# Patient Record
Sex: Female | Born: 1990 | Hispanic: Yes | Marital: Single | Smoking: Never smoker
Health system: Southern US, Community
[De-identification: ages and names within clinical notes are randomized; demographics above are authoritative.]

## PROBLEM LIST (undated history)

## (undated) ENCOUNTER — Inpatient Hospital Stay (HOSPITAL_COMMUNITY): Payer: Self-pay

## (undated) DIAGNOSIS — Z789 Other specified health status: Secondary | ICD-10-CM

## (undated) HISTORY — PX: NO PAST SURGERIES: SHX2092

---
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
## 2009-09-26 ENCOUNTER — Emergency Department (HOSPITAL_COMMUNITY): Admission: EM | Admit: 2009-09-26 | Discharge: 2009-09-26 | Payer: Self-pay | Admitting: Family Medicine

## 2010-03-01 ENCOUNTER — Emergency Department (HOSPITAL_COMMUNITY)
Admission: EM | Admit: 2010-03-01 | Discharge: 2010-03-01 | Disposition: A | Discharge status: Home / Self Care | Payer: Self-pay | Attending: Emergency Medicine | Admitting: Emergency Medicine

## 2010-03-01 DIAGNOSIS — R109 Unspecified abdominal pain: Secondary | ICD-10-CM | POA: Insufficient documentation

## 2010-03-01 DIAGNOSIS — N898 Other specified noninflammatory disorders of vagina: Secondary | ICD-10-CM | POA: Insufficient documentation

## 2010-03-01 DIAGNOSIS — N72 Inflammatory disease of cervix uteri: Secondary | ICD-10-CM | POA: Insufficient documentation

## 2010-03-01 LAB — URINE MICROSCOPIC-ADD ON

## 2010-03-01 LAB — URINALYSIS, ROUTINE W REFLEX MICROSCOPIC
Bilirubin Urine: NEGATIVE
Ketones, ur: 15 mg/dL — AB
Nitrite: NEGATIVE
Specific Gravity, Urine: 1.017 (ref 1.005–1.030)
pH: 8.5 — ABNORMAL HIGH (ref 5.0–8.0)

## 2010-03-01 LAB — WET PREP, GENITAL
Trich, Wet Prep: NONE SEEN
Yeast Wet Prep HPF POC: NONE SEEN

## 2010-03-02 LAB — GC/CHLAMYDIA PROBE AMP, GENITAL: GC Probe Amp, Genital: NEGATIVE

## 2010-04-14 LAB — POCT URINALYSIS DIPSTICK
Bilirubin Urine: NEGATIVE
Glucose, UA: NEGATIVE mg/dL
Ketones, ur: NEGATIVE mg/dL

## 2010-04-14 LAB — POCT PREGNANCY, URINE

## 2010-07-02 ENCOUNTER — Emergency Department (HOSPITAL_COMMUNITY)
Admission: EM | Admit: 2010-07-02 | Discharge: 2010-07-02 | Discharge status: Against Medical Advice | Payer: BC Managed Care – PPO | Attending: Emergency Medicine | Admitting: Emergency Medicine

## 2010-07-02 DIAGNOSIS — O9989 Other specified diseases and conditions complicating pregnancy, childbirth and the puerperium: Secondary | ICD-10-CM | POA: Insufficient documentation

## 2010-07-02 DIAGNOSIS — R109 Unspecified abdominal pain: Secondary | ICD-10-CM | POA: Insufficient documentation

## 2010-07-02 LAB — URINALYSIS, ROUTINE W REFLEX MICROSCOPIC
Hgb urine dipstick: NEGATIVE
Protein, ur: NEGATIVE mg/dL
Urobilinogen, UA: 0.2 mg/dL (ref 0.0–1.0)

## 2010-07-02 LAB — URINE MICROSCOPIC-ADD ON

## 2010-07-23 ENCOUNTER — Emergency Department (HOSPITAL_COMMUNITY)
Admission: EM | Admit: 2010-07-23 | Discharge: 2010-07-23 | Discharge status: Against Medical Advice | Payer: BC Managed Care – PPO | Attending: Emergency Medicine | Admitting: Emergency Medicine

## 2010-07-23 ENCOUNTER — Emergency Department (HOSPITAL_COMMUNITY)
Admission: EM | Admit: 2010-07-23 | Discharge: 2010-07-24 | Disposition: A | Discharge status: Home / Self Care | Payer: BC Managed Care – PPO | Attending: Emergency Medicine | Admitting: Emergency Medicine

## 2010-07-23 DIAGNOSIS — R109 Unspecified abdominal pain: Secondary | ICD-10-CM | POA: Insufficient documentation

## 2010-07-23 DIAGNOSIS — O99891 Other specified diseases and conditions complicating pregnancy: Secondary | ICD-10-CM | POA: Insufficient documentation

## 2010-07-23 DIAGNOSIS — M549 Dorsalgia, unspecified: Secondary | ICD-10-CM | POA: Insufficient documentation

## 2010-07-23 LAB — URINE MICROSCOPIC-ADD ON

## 2010-07-23 LAB — URINALYSIS, ROUTINE W REFLEX MICROSCOPIC
Glucose, UA: NEGATIVE mg/dL
Ketones, ur: NEGATIVE mg/dL
pH: 6.5 (ref 5.0–8.0)

## 2010-07-24 ENCOUNTER — Emergency Department (HOSPITAL_COMMUNITY): Payer: BC Managed Care – PPO

## 2010-07-24 LAB — HCG, QUANTITATIVE, PREGNANCY: hCG, Beta Chain, Quant, S: 54337 m[IU]/mL — ABNORMAL HIGH (ref <5)

## 2010-08-05 ENCOUNTER — Emergency Department (HOSPITAL_COMMUNITY)
Admission: EM | Admit: 2010-08-05 | Discharge: 2010-08-06 | Disposition: A | Discharge status: Home / Self Care | Payer: Self-pay | Attending: Emergency Medicine | Admitting: Emergency Medicine

## 2010-08-05 DIAGNOSIS — O210 Mild hyperemesis gravidarum: Secondary | ICD-10-CM | POA: Insufficient documentation

## 2010-08-05 LAB — URINALYSIS, ROUTINE W REFLEX MICROSCOPIC
Bilirubin Urine: NEGATIVE
Glucose, UA: NEGATIVE mg/dL
Hgb urine dipstick: NEGATIVE
Ketones, ur: NEGATIVE mg/dL
Leukocytes, UA: NEGATIVE
Protein, ur: NEGATIVE mg/dL

## 2010-08-05 LAB — DIFFERENTIAL
Basophils Absolute: 0 10*3/uL (ref 0.0–0.1)
Basophils Relative: 0 % (ref 0–1)
Lymphocytes Relative: 30 % (ref 12–46)
Neutro Abs: 5.7 10*3/uL (ref 1.7–7.7)
Neutrophils Relative %: 61 % (ref 43–77)

## 2010-08-05 LAB — BASIC METABOLIC PANEL
BUN: 5 mg/dL — ABNORMAL LOW (ref 6–23)
Chloride: 104 mEq/L (ref 96–112)
Glucose, Bld: 84 mg/dL (ref 70–99)
Potassium: 3.3 mEq/L — ABNORMAL LOW (ref 3.5–5.1)
Sodium: 137 mEq/L (ref 135–145)

## 2010-08-05 LAB — CBC
HCT: 36 % (ref 36.0–46.0)
Hemoglobin: 13.2 g/dL (ref 12.0–15.0)
WBC: 9.4 10*3/uL (ref 4.0–10.5)

## 2010-08-05 LAB — URINE MICROSCOPIC-ADD ON

## 2010-08-07 LAB — GC/CHLAMYDIA PROBE AMP, GENITAL: Chlamydia, DNA Probe: NEGATIVE

## 2011-01-30 NOTE — L&D Delivery Note (Signed)
Called for delivery while Dr. Tamela Oddi in the operating room. Patient fully dilated and pushing. Delivery of viable female infant over intact perineum with Apgars 7 and 9, weight 8lb 0.9oz, clear fluid. Nuchal cord x 1 reduced. Cord clamped and cut. Placenta delivered whole and intact with 3-vessel cord. Second degree perineal laceration noted and repaired with 2.0 Vicryl with excellent hemostasis and restoration of anatomy. No cervical, vaginal, or periurethral laceration noted. Fundus firm. Vaginal vault empty. Patient experienced uterine atony with estimated blood loss of 400 cc which was corrected with fundal massage and administration of 1000 mcg of cytotec per rectum.

## 2011-02-26 ENCOUNTER — Encounter (HOSPITAL_COMMUNITY): Payer: Self-pay | Admitting: *Deleted

## 2011-02-26 ENCOUNTER — Inpatient Hospital Stay (HOSPITAL_COMMUNITY)
Admission: AD | Admit: 2011-02-26 | Discharge: 2011-02-26 | Disposition: A | Discharge status: Home / Self Care | Payer: Self-pay | Source: Ambulatory Visit | Attending: Obstetrics & Gynecology | Admitting: Obstetrics & Gynecology

## 2011-02-26 DIAGNOSIS — O479 False labor, unspecified: Secondary | ICD-10-CM | POA: Insufficient documentation

## 2011-02-26 DIAGNOSIS — R109 Unspecified abdominal pain: Secondary | ICD-10-CM | POA: Insufficient documentation

## 2011-02-26 LAB — URINALYSIS, ROUTINE W REFLEX MICROSCOPIC
Glucose, UA: NEGATIVE mg/dL
Hgb urine dipstick: NEGATIVE
Specific Gravity, Urine: 1.02 (ref 1.005–1.030)
Urobilinogen, UA: 0.2 mg/dL (ref 0.0–1.0)

## 2011-02-26 LAB — WET PREP, GENITAL
Trich, Wet Prep: NONE SEEN
Yeast Wet Prep HPF POC: NONE SEEN

## 2011-02-26 NOTE — Progress Notes (Signed)
Pt. brought to room #7 via W/C from lobby due to C/O pain

## 2011-02-26 NOTE — Progress Notes (Signed)
States had back pain last night. Describes abdominal pain as stabbing, worse when walking.

## 2011-02-26 NOTE — ED Provider Notes (Signed)
History    G2P0 presents at [redacted]w[redacted]d with report of lower abdominal pain "when I walk" and vaginal discharge.  She also reports a small gush of fluid earlier today when she sneezed but denies current LOF.  She also denies vaginal bleeding or regular cramping.  She reports normal fetal movement today.    Chief Complaint  Patient presents with  . Abdominal Pain   HPI  OB History    Grav Para Term Preterm Abortions TAB SAB Ect Mult Living   2 0 0 0 0 0 0 0 0 0         History  Substance Use Topics  . Smoking status: Never Smoker   . Smokeless tobacco: Never Used  . Alcohol Use: No    Allergies: No Known Allergies  Prescriptions prior to admission  Medication Sig Dispense Refill  . diphenhydramine-acetaminophen (TYLENOL PM) 25-500 MG TABS Take 1 tablet by mouth at bedtime as needed. Patient has used this medication for sleep.      . Prenatal Vit-Fe Fumarate-FA (PRENATAL MULTIVITAMIN) TABS Take 1 tablet by mouth daily.        Review of Systems  Constitutional: Negative for fever and chills.  Gastrointestinal: Positive for abdominal pain.  Genitourinary: Negative for dysuria, urgency and frequency.   Physical Exam   Blood pressure 104/75, pulse 90, temperature 97.1 F (36.2 C), temperature source Oral, resp. rate 18, SpO2 98.00%.  Physical Exam  Constitutional: She is oriented to person, place, and time. She appears well-developed and well-nourished.  Respiratory: Effort normal.  GI: Soft.  Genitourinary: Vagina normal.       Pelvic exam: Small amount white creamy discharge, no pooling of fluid noted in posterior fornix and no fluid from cervical os when pt coughs.  Cervix 1/50/-3  Musculoskeletal: Normal range of motion.  Neurological: She is alert and oriented to person, place, and time.  Skin: Skin is warm and dry.  Psychiatric: She has a normal mood and affect. Her behavior is normal. Judgment and thought content normal.   FHR 125 with accels, no decels and moderate  variability Contractions 1 in 10 minutes, irregular, lasting 40-70 seconds Results for orders placed during the hospital encounter of 02/26/11 (from the past 24 hour(s))  WET PREP, GENITAL     Status: Abnormal   Collection Time   02/26/11  3:04 PM      Component Value Range   Yeast, Wet Prep NONE SEEN  NONE SEEN    Trich, Wet Prep NONE SEEN  NONE SEEN    Clue Cells, Wet Prep NONE SEEN  NONE SEEN    WBC, Wet Prep HPF POC FEW (*) NONE SEEN   POCT FERN TEST     Status: Normal   Collection Time   02/26/11  3:14 PM      Component Value Range   Fern Test Negative    POCT FERN TEST     Status: Normal   Collection Time   02/26/11  3:16 PM      Component Value Range   Fern Test Negative    URINALYSIS, ROUTINE W REFLEX MICROSCOPIC     Status: Normal   Collection Time   02/26/11  3:52 PM      Component Value Range   Color, Urine YELLOW  YELLOW    APPearance CLEAR  CLEAR    Specific Gravity, Urine 1.020  1.005 - 1.030    pH 6.5  5.0 - 8.0    Glucose, UA NEGATIVE  NEGATIVE (mg/dL)  Hgb urine dipstick NEGATIVE  NEGATIVE    Bilirubin Urine NEGATIVE  NEGATIVE    Ketones, ur NEGATIVE  NEGATIVE (mg/dL)   Protein, ur NEGATIVE  NEGATIVE (mg/dL)   Urobilinogen, UA 0.2  0.0 - 1.0 (mg/dL)   Nitrite NEGATIVE  NEGATIVE    Leukocytes, UA NEGATIVE  NEGATIVE    MAU Course  Procedures U/A Pelvic exam with wet prep, slide for ferning, GC/Chlamydia Cervical exam FHR monitoring  Discussed POC with Dr. Tamela Oddi and she agrees with D/C with labor precautions. Assessment and Plan  A: Braxton-Hicks contractions Discomforts of pregnancy  P: D/C home with labor precautions  LEFTWICH-KIRBY, Amirra Herling 02/26/2011, 3:20 PM

## 2011-02-26 NOTE — Progress Notes (Signed)
Abdominal pain, thick yellow D/C. D/C started last week, pain since last night

## 2011-02-26 NOTE — Progress Notes (Signed)
[facility] Department of Clinical Social Work Documentation of Interpretation   I assisted _Susan RN__________________ with interpretation of ___questions___________________ for this patient. 

## 2011-02-27 LAB — GC/CHLAMYDIA PROBE AMP, GENITAL: GC Probe Amp, Genital: NEGATIVE

## 2011-03-09 ENCOUNTER — Encounter (HOSPITAL_COMMUNITY): Payer: Self-pay | Admitting: *Deleted

## 2011-03-09 ENCOUNTER — Inpatient Hospital Stay (HOSPITAL_COMMUNITY)
Admission: AD | Admit: 2011-03-09 | Discharge: 2011-03-12 | DRG: 774 | Disposition: A | Discharge status: Home / Self Care | Payer: Medicaid Other | Source: Ambulatory Visit | Attending: Obstetrics & Gynecology | Admitting: Obstetrics & Gynecology

## 2011-03-09 HISTORY — DX: Other specified health status: Z78.9

## 2011-03-09 LAB — CBC
HCT: 36.5 % (ref 36.0–46.0)
HCT: 34 % — ABNORMAL LOW (ref 36.0–46.0)
Hemoglobin: 11.9 g/dL — ABNORMAL LOW (ref 12.0–15.0)
Hemoglobin: 11.1 g/dL — ABNORMAL LOW (ref 12.0–15.0)
MCH: 25.7 pg — ABNORMAL LOW (ref 26.0–34.0)
MCHC: 32.6 g/dL (ref 30.0–36.0)
MCV: 78.5 fL (ref 78.0–100.0)
Platelets: 257 10*3/uL (ref 150–400)
RBC: 4.33 MIL/uL (ref 3.87–5.11)
WBC: 13.5 10*3/uL — ABNORMAL HIGH (ref 4.0–10.5)

## 2011-03-09 LAB — HIV ANTIBODY (ROUTINE TESTING W REFLEX): HIV: NONREACTIVE

## 2011-03-09 LAB — DIFFERENTIAL
Eosinophils Relative: 0 % (ref 0–5)
Lymphocytes Relative: 13 % (ref 12–46)
Lymphs Abs: 1.7 10*3/uL (ref 0.7–4.0)
Monocytes Absolute: 0.7 10*3/uL (ref 0.1–1.0)
Monocytes Relative: 5 % (ref 3–12)

## 2011-03-09 LAB — RPR: RPR: NONREACTIVE

## 2011-03-09 LAB — RUBELLA ANTIBODY, IGM: Rubella: IMMUNE

## 2011-03-09 LAB — RAPID HIV SCREEN (WH-MAU): Rapid HIV Screen: NONREACTIVE

## 2011-03-09 MED ORDER — ACETAMINOPHEN 325 MG PO TABS: 650.0000 mg | ORAL_TABLET | ORAL | Status: DC | PRN

## 2011-03-09 MED ORDER — LACTATED RINGERS IV SOLN
INTRAVENOUS | Status: DC
  Administered 2011-03-10: 02:00:00 via INTRAVENOUS

## 2011-03-09 MED ORDER — LACTATED RINGERS IV SOLN: 500.0000 mL | INTRAVENOUS | Status: DC | PRN

## 2011-03-09 MED ORDER — ONDANSETRON HCL 4 MG/2ML IJ SOLN: 4.0000 mg | Freq: Four times a day (QID) | INTRAMUSCULAR | Status: DC | PRN

## 2011-03-09 MED ORDER — CITRIC ACID-SODIUM CITRATE 334-500 MG/5ML PO SOLN: 30.0000 mL | ORAL | Status: DC | PRN

## 2011-03-09 MED ORDER — LIDOCAINE HCL (PF) 1 % IJ SOLN
30.0000 mL | INTRAMUSCULAR | Status: AC | PRN
  Administered 2011-03-10: 30 mL via SUBCUTANEOUS
  Filled 2011-03-09: qty 30

## 2011-03-09 MED ORDER — IBUPROFEN 600 MG PO TABS
600.0000 mg | ORAL_TABLET | Freq: Four times a day (QID) | ORAL | Status: DC | PRN
  Administered 2011-03-10: 600 mg via ORAL
  Filled 2011-03-09: qty 1

## 2011-03-09 MED ORDER — OXYTOCIN 20 UNITS IN LACTATED RINGERS INFUSION - SIMPLE
125.0000 mL/h | Freq: Once | INTRAVENOUS | Status: AC
  Administered 2011-03-10: 500 mL/h via INTRAVENOUS

## 2011-03-09 MED ORDER — FLEET ENEMA 7-19 GM/118ML RE ENEM: 1.0000 | ENEMA | RECTAL | Status: DC | PRN

## 2011-03-09 MED ORDER — OXYCODONE-ACETAMINOPHEN 5-325 MG PO TABS: 1.0000 | ORAL_TABLET | ORAL | Status: DC | PRN

## 2011-03-09 MED ORDER — OXYTOCIN BOLUS FROM INFUSION
500.0000 mL | Freq: Once | INTRAVENOUS | Status: DC
  Filled 2011-03-09: qty 1000
  Filled 2011-03-09: qty 500

## 2011-03-09 MED ORDER — BUTORPHANOL TARTRATE 2 MG/ML IJ SOLN
1.0000 mg | INTRAMUSCULAR | Status: DC | PRN
  Administered 2011-03-09 (×2): 1 mg via INTRAVENOUS
  Filled 2011-03-09 (×2): qty 1

## 2011-03-09 NOTE — H&P (Signed)
Amber Murphy is a 21 y.o. female presenting for contractions. Maternal Medical History:  Reason for admission: Reason for admission: contractions.  Contractions: Frequency: regular.    Fetal activity: Perceived fetal activity is normal.    Prenatal complications: no prenatal complications   OB History    Grav Para Term Preterm Abortions TAB SAB Ect Mult Living   1         0     Past Medical History  Diagnosis Date  . No pertinent past medical history    Past Surgical History  Procedure Date  . No past surgeries    Family History: family history includes Early death in her father. Social History:  reports that she has never smoked. She has never used smokeless tobacco. She reports that she does not drink alcohol or use illicit drugs.  Review of Systems  Constitutional: Negative for fever.  Eyes: Negative for blurred vision.  Respiratory: Negative for shortness of breath.   Gastrointestinal: Negative for vomiting.  Skin: Negative for rash.  Neurological: Negative for headaches.    Dilation: 5.5 Effacement (%): 90 Station: -2 Exam by:: Penley, RN  Blood pressure 127/82, pulse 81, temperature 97.9 F (36.6 C), temperature source Oral, resp. rate 18, height 5\' 4"  (1.626 m), weight 66.225 kg (146 lb). Maternal Exam:  Abdomen: Fetal presentation: vertex  Introitus: not evaluated.   Cervix: Cervix evaluated by digital exam.     Fetal Exam Fetal Monitor Review: Variability: moderate (6-25 bpm).   Pattern: accelerations present and no decelerations.    Fetal State Assessment: Category I - tracings are normal.     Physical Exam  Constitutional: She appears well-developed.  HENT:  Head: Normocephalic.  Neck: Neck supple. No thyromegaly present.  Cardiovascular: Normal rate and regular rhythm.   Respiratory: Breath sounds normal.  GI: Soft. Bowel sounds are normal.  Skin: No rash noted.    Prenatal labs: ABO, Rh: --/--/O POS (02/08 2211) Antibody:     Rubella:   RPR:    HBsAg:    HIV: Non-reactive (02/08 2318)  GBS: Unknown (02/08 0000)   Assessment/Plan: [place] at term, active labor, Category 1 FHT. Admit, anticipate an NSVD   JACKSON-MOORE,Flora Ratz A 03/09/2011, 11:21 PM

## 2011-03-09 NOTE — Progress Notes (Signed)
Pt seen in [facility] office today and told VE 4cm.  Pt has had U/C's today since 1100 coming 2-5 minutes.  No ROM, pt having bloody show with mucous.

## 2011-03-10 ENCOUNTER — Encounter (HOSPITAL_COMMUNITY): Payer: Self-pay | Admitting: *Deleted

## 2011-03-10 LAB — ANTIBODY SCREEN: Antibody Screen: NEGATIVE

## 2011-03-10 LAB — HEMOGLOBIN AND HEMATOCRIT, BLOOD: HCT: 26.5 % — ABNORMAL LOW (ref 36.0–46.0)

## 2011-03-10 MED ORDER — TETANUS-DIPHTH-ACELL PERTUSSIS 5-2.5-18.5 LF-MCG/0.5 IM SUSP
0.5000 mL | Freq: Once | INTRAMUSCULAR | Status: AC
  Administered 2011-03-11: 0.5 mL via INTRAMUSCULAR
  Filled 2011-03-10: qty 0.5

## 2011-03-10 MED ORDER — INFLUENZA VIRUS VACC SPLIT PF IM SUSP
0.5000 mL | INTRAMUSCULAR | Status: AC
  Administered 2011-03-11: 0.5 mL via INTRAMUSCULAR
  Filled 2011-03-10: qty 0.5

## 2011-03-10 MED ORDER — MEDROXYPROGESTERONE ACETATE 150 MG/ML IM SUSP: 150.0000 mg | INTRAMUSCULAR | Status: DC | PRN

## 2011-03-10 MED ORDER — PHENYLEPHRINE 40 MCG/ML (10ML) SYRINGE FOR IV PUSH (FOR BLOOD PRESSURE SUPPORT): 80.0000 ug | PREFILLED_SYRINGE | INTRAVENOUS | Status: DC | PRN

## 2011-03-10 MED ORDER — MISOPROSTOL 200 MCG PO TABS
ORAL_TABLET | ORAL | Status: AC
  Administered 2011-03-10: 1000 ug via RECTAL
  Filled 2011-03-10: qty 5

## 2011-03-10 MED ORDER — DIBUCAINE 1 % RE OINT: 1.0000 "application " | TOPICAL_OINTMENT | RECTAL | Status: DC | PRN

## 2011-03-10 MED ORDER — MEASLES, MUMPS & RUBELLA VAC ~~LOC~~ INJ
0.5000 mL | INJECTION | Freq: Once | SUBCUTANEOUS | Status: DC
  Filled 2011-03-10: qty 0.5

## 2011-03-10 MED ORDER — LACTATED RINGERS IV SOLN: 500.0000 mL | Freq: Once | INTRAVENOUS | Status: DC

## 2011-03-10 MED ORDER — FERROUS SULFATE 325 (65 FE) MG PO TABS
325.0000 mg | ORAL_TABLET | Freq: Two times a day (BID) | ORAL | Status: DC
  Administered 2011-03-10 – 2011-03-12 (×5): 325 mg via ORAL
  Filled 2011-03-10 (×5): qty 1

## 2011-03-10 MED ORDER — SENNOSIDES-DOCUSATE SODIUM 8.6-50 MG PO TABS
2.0000 | ORAL_TABLET | Freq: Every day | ORAL | Status: DC
  Administered 2011-03-10 – 2011-03-11 (×2): 2 via ORAL

## 2011-03-10 MED ORDER — FENTANYL 2.5 MCG/ML BUPIVACAINE 1/10 % EPIDURAL INFUSION (WH - ANES): 14.0000 mL/h | INTRAMUSCULAR | Status: DC

## 2011-03-10 MED ORDER — ONDANSETRON HCL 4 MG/2ML IJ SOLN: 4.0000 mg | INTRAMUSCULAR | Status: DC | PRN

## 2011-03-10 MED ORDER — PRENATAL MULTIVITAMIN CH
1.0000 | ORAL_TABLET | Freq: Every day | ORAL | Status: DC
  Administered 2011-03-10 – 2011-03-12 (×3): 1 via ORAL
  Filled 2011-03-10 (×3): qty 1

## 2011-03-10 MED ORDER — IBUPROFEN 600 MG PO TABS
600.0000 mg | ORAL_TABLET | Freq: Four times a day (QID) | ORAL | Status: DC
  Administered 2011-03-10 – 2011-03-12 (×8): 600 mg via ORAL
  Filled 2011-03-10 (×8): qty 1

## 2011-03-10 MED ORDER — EPHEDRINE 5 MG/ML INJ: 10.0000 mg | INTRAVENOUS | Status: DC | PRN

## 2011-03-10 MED ORDER — MAGNESIUM HYDROXIDE 400 MG/5ML PO SUSP: 30.0000 mL | ORAL | Status: DC | PRN

## 2011-03-10 MED ORDER — OXYCODONE-ACETAMINOPHEN 5-325 MG PO TABS
1.0000 | ORAL_TABLET | ORAL | Status: DC | PRN
  Administered 2011-03-10 (×3): 1 via ORAL
  Administered 2011-03-11: 2 via ORAL
  Filled 2011-03-10 (×2): qty 1
  Filled 2011-03-10: qty 2
  Filled 2011-03-10: qty 1

## 2011-03-10 MED ORDER — BENZOCAINE-MENTHOL 20-0.5 % EX AERO
INHALATION_SPRAY | CUTANEOUS | Status: AC
  Administered 2011-03-10: 08:00:00
  Filled 2011-03-10: qty 56

## 2011-03-10 MED ORDER — BENZOCAINE-MENTHOL 20-0.5 % EX AERO: 1.0000 "application " | INHALATION_SPRAY | CUTANEOUS | Status: DC | PRN

## 2011-03-10 MED ORDER — ONDANSETRON HCL 4 MG PO TABS: 4.0000 mg | ORAL_TABLET | ORAL | Status: DC | PRN

## 2011-03-10 MED ORDER — ZOLPIDEM TARTRATE 5 MG PO TABS: 5.0000 mg | ORAL_TABLET | Freq: Every evening | ORAL | Status: DC | PRN

## 2011-03-10 MED ORDER — DIPHENHYDRAMINE HCL 50 MG/ML IJ SOLN: 12.5000 mg | INTRAMUSCULAR | Status: DC | PRN

## 2011-03-10 MED ORDER — WITCH HAZEL-GLYCERIN EX PADS: 1.0000 "application " | MEDICATED_PAD | CUTANEOUS | Status: DC | PRN

## 2011-03-10 MED ORDER — LANOLIN HYDROUS EX OINT: TOPICAL_OINTMENT | CUTANEOUS | Status: DC | PRN

## 2011-03-10 MED ORDER — DIPHENHYDRAMINE HCL 25 MG PO CAPS: 25.0000 mg | ORAL_CAPSULE | Freq: Four times a day (QID) | ORAL | Status: DC | PRN

## 2011-03-10 NOTE — Progress Notes (Signed)
Patient ID: Amber Murphy, female   DOB: September 24, 1990, 21 y.o.   MRN: [id number] Post Partum Day 0 S/P spontaneous vaginal RH status/Rubella reviewed.  Feeding: breast Subjective: No HA, SOB, CP, F/C, breast symptoms. Normal vaginal bleeding, no clots.     Objective: BP 95/60  Pulse 106  Temp(Src) 98.3 F (36.8 C) (Axillary)  Resp 20  Ht 5\' 4"  (1.626 m)  Wt 66.225 kg (146 lb)  BMI 25.06 kg/m2  Breastfeeding? Unknown I&O reviewed.    Physical Exam:  Pt w/Pediatrician.  Bathing infant  Basename 03/09/11 2211 03/09/11 2130  HGB 11.1* 11.9*  HCT 34.0* 36.5      Assessment/Plan: 21 y.o.  PPD #0 .  Tachycardic.  Atony at time of delivery.  Lochia OK at present Continue current postpartum care Check H&H   LOS: 1 day   JACKSON-MOORE,Amber Murphy 03/10/2011, 11:04 AM

## 2011-03-11 LAB — CBC
HCT: 27.5 % — ABNORMAL LOW (ref 36.0–46.0)
Hemoglobin: 8.9 g/dL — ABNORMAL LOW (ref 12.0–15.0)
MCHC: 32.4 g/dL (ref 30.0–36.0)
MCV: 79 fL (ref 78.0–100.0)
RDW: 15.4 % (ref 11.5–15.5)

## 2011-03-11 NOTE — Progress Notes (Signed)
Patient ID: Amber Murphy, female   DOB: 14-Apr-1990, 21 y.o.   MRN: [id number] Post Partum Day 1 S/P spontaneous vaginal RH status/Rubella reviewed.  Feeding: breast Subjective: No HA, SOB, CP, F/C, breast symptoms. Normal vaginal bleeding, no clots.     Objective: BP 109/61  Pulse 99  Temp(Src) 97.8 F (36.6 C) (Oral)  Resp 18  Ht 5\' 4"  (1.626 m)  Wt 66.225 kg (146 lb)  BMI 25.06 kg/m2  Breastfeeding? Unknown   Physical Exam:  General: alert Lochia: appropriate Uterine Fundus: firm DVT Evaluation: No evidence of DVT seen on physical exam. Ext: No c/c/e  Basename 03/11/11 0517 03/10/11 1130  HGB 8.9* 8.7*  HCT 27.5* 26.5*      Assessment/Plan: 21 y.o.  PPD #1 .  normal postpartum exam Continue current postpartum care  Ambulate   LOS: 2 days   JACKSON-MOORE,Hykeem Ojeda A 03/11/2011, 11:43 AM

## 2011-03-12 MED ORDER — IBUPROFEN 600 MG PO TABS: 600.0000 mg | ORAL_TABLET | Freq: Four times a day (QID) | ORAL | Status: DC

## 2011-03-12 NOTE — Progress Notes (Signed)
Post Partum Day 2 Subjective: no complaints  Objective: Blood pressure 100/61, pulse 87, temperature 98.2 F (36.8 C), temperature source Oral, resp. rate 18, height 5\' 4"  (1.626 m), weight 146 lb (66.225 kg), unknown if currently breastfeeding.  Physical Exam:  General: alert and no distress Lochia: appropriate Uterine Fundus: firm Incision: healing well DVT Evaluation: No evidence of DVT seen on physical exam.   Basename 03/11/11 0517 03/10/11 1130  HGB 8.9* 8.7*  HCT 27.5* 26.5*    Assessment/Plan: Discharge home   LOS: 3 days   HARPER,CHARLES A 03/12/2011, 8:43 AM

## 2011-03-12 NOTE — Progress Notes (Signed)
UR chart review completed.  

## 2011-03-12 NOTE — Progress Notes (Signed)
[facility] Department of Clinical Social Work Documentation of Interpretation   I assisted ___Julie RN________________ with interpretation of _discharge_____________________ for this patient.

## 2011-03-12 NOTE — Discharge Summary (Signed)
Obstetric Discharge Summary Reason for Admission: onset of labor Prenatal Procedures: ultrasound Intrapartum Procedures: spontaneous vaginal delivery Postpartum Procedures: none Complications-Operative and Postpartum: none Hemoglobin  Date Value Range Status  03/11/2011 8.9* 12.0-15.0 (g/dL) Final     HCT  Date Value Range Status  03/11/2011 27.5* 36.0-46.0 (%) Final    Discharge Diagnoses: Term Pregnancy-delivered  Discharge Information: Date: 03/12/2011 Activity: pelvic rest Diet: routine Medications: PNV, Ibuprofen and Colace Condition: stable Instructions: refer to practice specific booklet Discharge to: home Follow-up Information    Follow up with Lucrecia Mcphearson A, MD. Schedule an appointment as soon as possible for a visit in 6 weeks.   Contact information:   [address] [place] [address] [phone]          Newborn Data: Live born female  Birth Weight: 8 lb 0.9 oz (3654 g) APGAR: 7, 9  Home with mother.  Kajol Crispen A 03/12/2011, 8:50 AM

## 2011-03-15 ENCOUNTER — Encounter (HOSPITAL_COMMUNITY): Payer: Self-pay | Admitting: *Deleted

## 2011-03-16 ENCOUNTER — Encounter (HOSPITAL_COMMUNITY): Payer: Self-pay | Admitting: *Deleted

## 2013-11-30 ENCOUNTER — Encounter (HOSPITAL_COMMUNITY): Payer: Self-pay | Admitting: *Deleted

## 2014-05-16 ENCOUNTER — Emergency Department (HOSPITAL_COMMUNITY)
Admission: EM | Admit: 2014-05-16 | Discharge: 2014-05-16 | Disposition: A | Discharge status: Home / Self Care | Payer: Medicaid Other | Attending: Emergency Medicine | Admitting: Emergency Medicine

## 2014-05-16 ENCOUNTER — Emergency Department (HOSPITAL_COMMUNITY): Payer: Medicaid Other

## 2014-05-16 ENCOUNTER — Encounter (HOSPITAL_COMMUNITY): Payer: Self-pay | Admitting: *Deleted

## 2014-05-16 DIAGNOSIS — Z3A01 Less than 8 weeks gestation of pregnancy: Secondary | ICD-10-CM | POA: Insufficient documentation

## 2014-05-16 DIAGNOSIS — O209 Hemorrhage in early pregnancy, unspecified: Secondary | ICD-10-CM | POA: Insufficient documentation

## 2014-05-16 LAB — COMPREHENSIVE METABOLIC PANEL
ALT: 12 U/L (ref 0–35)
AST: 18 U/L (ref 0–37)
Albumin: 4 g/dL (ref 3.5–5.2)
Alkaline Phosphatase: 62 U/L (ref 39–117)
Anion gap: 10 (ref 5–15)
BILIRUBIN TOTAL: 0.8 mg/dL (ref 0.3–1.2)
BUN: 7 mg/dL (ref 6–23)
CO2: 22 mmol/L (ref 19–32)
CREATININE: 0.5 mg/dL (ref 0.50–1.10)
Calcium: 9 mg/dL (ref 8.4–10.5)
Chloride: 105 mmol/L (ref 96–112)
Glucose, Bld: 95 mg/dL (ref 70–99)
POTASSIUM: 3.5 mmol/L (ref 3.5–5.1)
SODIUM: 137 mmol/L (ref 135–145)
Total Protein: 7.3 g/dL (ref 6.0–8.3)

## 2014-05-16 LAB — CBC WITH DIFFERENTIAL/PLATELET
BASOS ABS: 0 10*3/uL (ref 0.0–0.1)
Basophils Relative: 0 % (ref 0–1)
EOS ABS: 0.1 10*3/uL (ref 0.0–0.7)
Eosinophils Relative: 2 % (ref 0–5)
HEMATOCRIT: 39.5 % (ref 36.0–46.0)
HEMOGLOBIN: 13.4 g/dL (ref 12.0–15.0)
LYMPHS ABS: 1.7 10*3/uL (ref 0.7–4.0)
LYMPHS PCT: 26 % (ref 12–46)
MCH: 29.2 pg (ref 26.0–34.0)
MCHC: 33.9 g/dL (ref 30.0–36.0)
MCV: 86.1 fL (ref 78.0–100.0)
Monocytes Absolute: 0.4 10*3/uL (ref 0.1–1.0)
Monocytes Relative: 7 % (ref 3–12)
Neutro Abs: 4.1 10*3/uL (ref 1.7–7.7)
Neutrophils Relative %: 65 % (ref 43–77)
PLATELETS: 244 10*3/uL (ref 150–400)
RBC: 4.59 MIL/uL (ref 3.87–5.11)
RDW: 12.4 % (ref 11.5–15.5)
WBC: 6.3 10*3/uL (ref 4.0–10.5)

## 2014-05-16 LAB — HCG, QUANTITATIVE, PREGNANCY: HCG, BETA CHAIN, QUANT, S: 13 m[IU]/mL — AB (ref <5)

## 2014-05-16 LAB — WET PREP, GENITAL
Clue Cells Wet Prep HPF POC: NONE SEEN
Trich, Wet Prep: NONE SEEN
YEAST WET PREP: NONE SEEN

## 2014-05-16 LAB — I-STAT BETA HCG BLOOD, ED (MC, WL, AP ONLY): I-stat hCG, quantitative: 15.4 m[IU]/mL — ABNORMAL HIGH (ref <5)

## 2014-05-16 LAB — ABO/RH: ABO/RH(D): O POS

## 2014-05-16 NOTE — ED Notes (Signed)
Pelvic at bedside   

## 2014-05-16 NOTE — ED Notes (Addendum)
Pt reports abnormal vaginal bleeding x 4 months but became heavier today. Reports left side lower back pain and lower abd pains. Reports missing last dose of depo inj.

## 2014-05-16 NOTE — Discharge Instructions (Signed)
Vaginal Bleeding During Pregnancy, First Trimester A small amount of bleeding (spotting) from the vagina is common in early pregnancy. Sometimes the bleeding is normal and is not a problem, and sometimes it is a sign of something serious. Be sure to tell your doctor about any bleeding from your vagina right away. HOME CARE  Watch your condition for any changes.  Follow your doctor's instructions about how active you can be.  If you are on bed rest:  You may need to stay in bed and only get up to use the bathroom.  You may be allowed to do some activities.  If you need help, make plans for someone to help you.  Write down:  The number of pads you use each day.  How often you change pads.  How soaked (saturated) your pads are.  Do not use tampons.  Do not douche.  Do not have sex or orgasms until your doctor says it is okay.  If you pass any tissue from your vagina, save the tissue so you can show it to your doctor.  Only take medicines as told by your doctor.  Do not take aspirin because it can make you bleed.  Keep all follow-up visits as told by your doctor. GET HELP IF:   You bleed from your vagina.  You have cramps.  You have labor pains.  You have a fever that does not go away after you take medicine. GET HELP RIGHT AWAY IF:   You have very bad cramps in your back or belly (abdomen).  You pass large clots or tissue from your vagina.  You bleed more.  You feel light-headed or weak.  You pass out (faint).  You have chills.  You are leaking fluid or have a gush of fluid from your vagina.  You pass out while pooping (having a bowel movement). MAKE SURE YOU:  Understand these instructions.  Will watch your condition.  Will get help right away if you are not doing well or get worse. Document Released: 06/01/2013 Document Reviewed: 09/22/2012 [facility] Patient Information 2015 [place], [place]. This information is not intended to replace advice given  to you by your health care provider. Make sure you discuss any questions you have with your health care provider. Miscarriage A miscarriage is the sudden loss of an unborn baby (fetus) before the 20th week of pregnancy. Most miscarriages happen in the first 3 months of pregnancy. Sometimes, it happens before a woman even knows she is pregnant. A miscarriage is also called a "spontaneous miscarriage" or "early pregnancy loss." Having a miscarriage can be an emotional experience. Talk with your caregiver about any questions you may have about miscarrying, the grieving process, and your future pregnancy plans. CAUSES  Problems with the fetal chromosomes that make it impossible for the baby to develop normally. Problems with the baby's genes or chromosomes are most often the result of errors that occur, by chance, as the embryo divides and grows. The problems are not inherited from the parents. Infection of the cervix or uterus.  Hormone problems.  Problems with the cervix, such as having an incompetent cervix. This is when the tissue in the cervix is not strong enough to hold the pregnancy.  Problems with the uterus, such as an abnormally shaped uterus, uterine fibroids, or congenital abnormalities.  Certain medical conditions.  Smoking, drinking alcohol, or taking illegal drugs.  Trauma.  Often, the cause of a miscarriage is unknown.  SYMPTOMS  Vaginal bleeding or spotting, with or  without cramps or pain. Pain or cramping in the abdomen or lower back. Passing fluid, tissue, or blood clots from the vagina. DIAGNOSIS  Your caregiver will perform a physical exam. You may also have an ultrasound to confirm the miscarriage. Blood or urine tests may also be ordered. TREATMENT  Sometimes, treatment is not necessary if you naturally pass all the fetal tissue that was in the uterus. If some of the fetus or placenta remains in the body (incomplete miscarriage), tissue left behind may become infected  and must be removed. Usually, a dilation and curettage (D and C) procedure is performed. During a D and C procedure, the cervix is widened (dilated) and any remaining fetal or placental tissue is gently removed from the uterus. Antibiotic medicines are prescribed if there is an infection. Other medicines may be given to reduce the size of the uterus (contract) if there is a lot of bleeding. If you have Rh negative blood and your baby was Rh positive, you will need a Rh immunoglobulin shot. This shot will protect any future baby from having Rh blood problems in future pregnancies. HOME CARE INSTRUCTIONS  Your caregiver may order bed rest or may allow you to continue light activity. Resume activity as directed by your caregiver. Have someone help with home and family responsibilities during this time.  Keep track of the number of sanitary pads you use each day and how soaked (saturated) they are. Write down this information.  Do not use tampons. Do not douche or have sexual intercourse until approved by your caregiver.  Only take over-the-counter or prescription medicines for pain or discomfort as directed by your caregiver.  Do not take aspirin. Aspirin can cause bleeding.  Keep all follow-up appointments with your caregiver.  If you or your partner have problems with grieving, talk to your caregiver or seek counseling to help cope with the pregnancy loss. Allow enough time to grieve before trying to get pregnant again.  SEEK IMMEDIATE MEDICAL CARE IF:  You have severe cramps or pain in your back or abdomen. You have a fever. You pass large blood clots (walnut-sized or larger) ortissue from your vagina. Save any tissue for your caregiver to inspect.  Your bleeding increases.  You have a thick, bad-smelling vaginal discharge. You become lightheaded, weak, or you faint.  You have chills.  MAKE SURE YOU: Understand these instructions. Will watch your condition. Will get help right away  if you are not doing well or get worse. Document Released: 07/11/2000 Document Revised: 05/12/2012 Document Reviewed: 03/06/2011 [facility] Patient Information 2015 [place], [place]. This information is not intended to replace advice given to you by your health care provider. Make sure you discuss any questions you have with your health care provider.

## 2014-05-16 NOTE — ED Provider Notes (Signed)
CSN: [id number]     Arrival date & time 05/16/14  1148 History   First MD Initiated Contact with Patient 05/16/14 1505     Chief Complaint  Patient presents with  . Abdominal Pain  . Vaginal Bleeding     (Consider location/radiation/quality/duration/timing/severity/associated sxs/prior Treatment) Patient is a 24 y.o. female presenting with vaginal bleeding. The history is provided by the patient. No language interpreter was used.  Vaginal Bleeding Quality:  Bright red and clots Severity:  Moderate Onset quality:  Gradual Duration:  4 months Timing:  Constant Progression:  Worsening Chronicity:  New Menstrual history:  Irregular Possible pregnancy: yes   Relieved by:  None tried Worsened by:  Nothing tried Ineffective treatments:  None tried Associated symptoms: no abdominal pain   Risk factors: no bleeding disorder and no STD   Pt reports she stopped depo 4 months ago.   Pt reports bleeding/spotting since.  Pt reports heavy bleeding today.  No birth control  Past Medical History  Diagnosis Date  . No pertinent past medical history    Past Surgical History  Procedure Laterality Date  . No past surgeries     Family History  Problem Relation Age of Onset  . Early death Father    History  Substance Use Topics  . Smoking status: Never Smoker   . Smokeless tobacco: Never Used  . Alcohol Use: No   OB History    Gravida Para Term Preterm AB TAB SAB Ectopic Multiple Living   0 0 0 0 0 0 1     Review of Systems  Gastrointestinal: Negative for abdominal pain.  Genitourinary: Positive for vaginal bleeding.  All other systems reviewed and are negative.     Allergies  Review of patient's allergies indicates no known allergies.  Home Medications   Prior to Admission medications   Medication Sig Start Date End Date Taking? Authorizing Provider  diphenhydramine-acetaminophen (TYLENOL PM) 25-500 MG TABS Take 1 tablet by mouth at bedtime as needed. Patient has  used this medication for sleep.    Historical Provider, MD  ibuprofen (ADVIL,MOTRIN) 600 MG tablet Take 1 tablet (600 mg total) by mouth every 6 (six) hours. 03/12/11 03/22/11  Brock Bad, MD  Prenatal Vit-Fe Fumarate-FA (PRENATAL MULTIVITAMIN) TABS Take 1 tablet by mouth daily.    Historical Provider, MD  Prenatal Vit-Fe Fumarate-FA (PRENATAL MULTIVITAMIN) TABS Take 1 tablet by mouth daily.    Historical Provider, MD   BP 110/87 mmHg  Pulse 65  Temp(Src) 98 F (36.7 C) (Oral)  Resp 16  Wt 120 lb 5 oz (54.573 kg)  SpO2 100% Physical Exam  Constitutional: She is oriented to person, place, and time. She appears well-developed and well-nourished.  HENT:  Head: Normocephalic.  Eyes: EOM are normal.  Neck: Normal range of motion.  Pulmonary/Chest: Effort normal.  Abdominal: She exhibits no distension.  Genitourinary: Uterus normal.  Moderate vaginal bleeding,  Adnexa nontender no masses  Musculoskeletal: Normal range of motion.  Neurological: She is alert and oriented to person, place, and time.  Psychiatric: She has a normal mood and affect.  Nursing note and vitals reviewed.   ED Course  Procedures (including critical care time) Labs Review Labs Reviewed  I-STAT BETA HCG BLOOD, ED ([facility], [facility], [facility] ONLY) - Abnormal; Notable for the following:    I-stat hCG, quantitative 15.4 (*)    All other components within normal limits  CBC WITH DIFFERENTIAL/PLATELET  COMPREHENSIVE METABOLIC PANEL  URINALYSIS, ROUTINE W REFLEX MICROSCOPIC  Imaging Review No results found.   EKG Interpretation None      MDM  patient's pregnancy test is positive with a quantitated of 15.4 patient has a normal hemoglobin of 13.4. Pelvic ultrasound shows no intrauterine pregnancy radiologist recommend serial beta hCGs    Final diagnoses:  Vaginal bleeding in pregnancy, first trimester    Patient advised that she has most likely had a miscarriage. She is advised of the importance of 2 day recheck she  [facility] hCG and appropriate follow-up. Patient is advised to [facility] to [facility] if symptoms worsen or change area    Elson AreasLeslie K Emery Dupuy, PA-C 05/16/14 1745  Jerelyn ScottMartha Linker, MD 05/16/14 [phone]

## 2014-05-17 LAB — GC/CHLAMYDIA PROBE AMP (~~LOC~~) NOT AT ARMC
Chlamydia: NEGATIVE
NEISSERIA GONORRHEA: NEGATIVE

## 2016-08-10 ENCOUNTER — Encounter (HOSPITAL_COMMUNITY): Payer: Self-pay | Admitting: Emergency Medicine

## 2016-08-10 ENCOUNTER — Emergency Department (HOSPITAL_COMMUNITY)
Admission: EM | Admit: 2016-08-10 | Discharge: 2016-08-10 | Disposition: A | Discharge status: Home / Self Care | Payer: Self-pay | Attending: Emergency Medicine | Admitting: Emergency Medicine

## 2016-08-10 ENCOUNTER — Emergency Department (HOSPITAL_COMMUNITY): Payer: Self-pay

## 2016-08-10 DIAGNOSIS — O468X1 Other antepartum hemorrhage, first trimester: Secondary | ICD-10-CM

## 2016-08-10 DIAGNOSIS — Z3A01 Less than 8 weeks gestation of pregnancy: Secondary | ICD-10-CM | POA: Insufficient documentation

## 2016-08-10 DIAGNOSIS — O418X10 Other specified disorders of amniotic fluid and membranes, first trimester, not applicable or unspecified: Secondary | ICD-10-CM

## 2016-08-10 DIAGNOSIS — O418X11 Other specified disorders of amniotic fluid and membranes, first trimester, fetus 1: Secondary | ICD-10-CM | POA: Insufficient documentation

## 2016-08-10 DIAGNOSIS — R1030 Lower abdominal pain, unspecified: Secondary | ICD-10-CM | POA: Insufficient documentation

## 2016-08-10 LAB — URINALYSIS, ROUTINE W REFLEX MICROSCOPIC
BACTERIA UA: NONE SEEN
Bilirubin Urine: NEGATIVE
GLUCOSE, UA: NEGATIVE mg/dL
Ketones, ur: NEGATIVE mg/dL
Leukocytes, UA: NEGATIVE
NITRITE: NEGATIVE
Protein, ur: NEGATIVE mg/dL
SPECIFIC GRAVITY, URINE: 1.01 (ref 1.005–1.030)
pH: 7 (ref 5.0–8.0)

## 2016-08-10 LAB — RAPID HIV SCREEN (HIV 1/2 AB+AG)
HIV 1/2 ANTIBODIES: NONREACTIVE
HIV-1 P24 Antigen - HIV24: NONREACTIVE

## 2016-08-10 LAB — I-STAT BETA HCG BLOOD, ED (MC, WL, AP ONLY): I-stat hCG, quantitative: >2000 m[IU]/mL — ABNORMAL HIGH (ref <5)

## 2016-08-10 LAB — COMPREHENSIVE METABOLIC PANEL
ALT: 18 U/L (ref 14–54)
ANION GAP: 8 (ref 5–15)
AST: 19 U/L (ref 15–41)
Albumin: 4.2 g/dL (ref 3.5–5.0)
Alkaline Phosphatase: 54 U/L (ref 38–126)
BILIRUBIN TOTAL: 1 mg/dL (ref 0.3–1.2)
BUN: 10 mg/dL (ref 6–20)
CALCIUM: 8.9 mg/dL (ref 8.9–10.3)
CO2: 22 mmol/L (ref 22–32)
Chloride: 107 mmol/L (ref 101–111)
Creatinine, Ser: 0.5 mg/dL (ref 0.44–1.00)
GFR calc Af Amer: >60 mL/min (ref >60)
GFR calc non Af Amer: >60 mL/min (ref >60)
Glucose, Bld: 100 mg/dL — ABNORMAL HIGH (ref 65–99)
POTASSIUM: 3.5 mmol/L (ref 3.5–5.1)
Sodium: 137 mmol/L (ref 135–145)
TOTAL PROTEIN: 7.4 g/dL (ref 6.5–8.1)

## 2016-08-10 LAB — WET PREP, GENITAL
Clue Cells Wet Prep HPF POC: NONE SEEN
SPERM: NONE SEEN
Trich, Wet Prep: NONE SEEN
YEAST WET PREP: NONE SEEN

## 2016-08-10 LAB — CBC
HCT: 38.2 % (ref 36.0–46.0)
Hemoglobin: 13.6 g/dL (ref 12.0–15.0)
MCH: 29.3 pg (ref 26.0–34.0)
MCHC: 35.6 g/dL (ref 30.0–36.0)
MCV: 82.3 fL (ref 78.0–100.0)
PLATELETS: 256 10*3/uL (ref 150–400)
RBC: 4.64 MIL/uL (ref 3.87–5.11)
RDW: 13.1 % (ref 11.5–15.5)
WBC: 6.3 10*3/uL (ref 4.0–10.5)

## 2016-08-10 LAB — PROTIME-INR
INR: 1
PROTHROMBIN TIME: 13.2 s (ref 11.4–15.2)

## 2016-08-10 LAB — HCG, QUANTITATIVE, PREGNANCY: HCG, BETA CHAIN, QUANT, S: 8890 m[IU]/mL — AB (ref <5)

## 2016-08-10 MED ORDER — CVS WOMENS PRENATAL+DHA 28-0.975 & 200 MG PO MISC: 1.0000 | Freq: Every day | ORAL | 0 refills | Status: DC

## 2016-08-10 NOTE — ED Provider Notes (Signed)
[facility]-EMERGENCY DEPT Provider Note   CSN: [id number] Arrival date & time: 08/10/16  0753     History   Chief Complaint Chief Complaint  Patient presents with  . Vaginal Bleeding    pregnant    HPI Amber Murphy is a 26 y.o. female.  HPI  26 yo G2P1011 at estimated [redacted] wk GA here with abdominal pain. Pt states she first discovered she was pregnant early this week with two positive at home UPTs. Pt has since been feeling well until this AM. This morning, she awoke with menstrual period-like cramps of her lower abdomen with small amount of blood on her tissue after wiping following urination today. No gross vaginal bleeding. No passage of tissue. Pain is minimal, no alleviating or aggravating factors. Of note,pt has h/o SAB in 2016 but states that was much more painful and more bleeding than her current issue. Denies any urinary sx - no dysuria, frequency, or hematuria.  Past Medical History:  Diagnosis Date  . No pertinent past medical history     Patient Active Problem List   Diagnosis Date Noted  . Normal delivery 03/10/2011    Past Surgical History:  Procedure Laterality Date  . NO PAST SURGERIES      OB History    Gravida Para Term Preterm AB Living   2 1 1  0 0 1   SAB TAB Ectopic Multiple Live Births   0 0 0 0 1       Home Medications    Prior to Admission medications   Not on File    Family History Family History  Problem Relation Age of Onset  . Early death Father     Social History Social History  Substance Use Topics  . Smoking status: Never Smoker  . Smokeless tobacco: Never Used  . Alcohol use No     Allergies   Patient has no known allergies.   Review of Systems Review of Systems  Genitourinary: Positive for dyspareunia, menstrual problem, vaginal bleeding and vaginal pain.  All other systems reviewed and are negative.    Physical Exam Updated Vital Signs BP 99/76 (BP Location: Left Arm)   Pulse 63   Temp 98.1 F (36.7 C)  (Oral)   Resp 16   LMP 07/07/2016   SpO2 100%   Physical Exam  Constitutional: She is oriented to person, place, and time. She appears well-developed and well-nourished. No distress.  HENT:  Head: Normocephalic and atraumatic.  Eyes: Conjunctivae are normal.  Neck: Neck supple.  Cardiovascular: Normal rate, regular rhythm and normal heart sounds.  Exam reveals no friction rub.   No murmur heard. Pulmonary/Chest: Effort normal and breath sounds normal. No respiratory distress. She has no wheezes. She has no rales.  Abdominal: Soft. She exhibits no distension. There is tenderness (mild, bilateral lower quadrants).  Genitourinary:  Genitourinary Comments: Scant amount of brown discharge in vaginal vault. No active bleeding. Os closed. No CMT. No adnexal TTP.  Musculoskeletal: She exhibits no edema.  Neurological: She is alert and oriented to person, place, and time. She exhibits normal muscle tone.  Skin: Skin is warm. Capillary refill takes less than 2 seconds.  Psychiatric: She has a normal mood and affect.  Nursing note and vitals reviewed.    ED Treatments / Results  Labs (all labs ordered are listed, but only abnormal results are displayed) Labs Reviewed  WET PREP, GENITAL - Abnormal; Notable for the following:       Result Value  WBC, Wet Prep HPF POC MANY (*)    All other components within normal limits  COMPREHENSIVE METABOLIC PANEL - Abnormal; Notable for the following:    Glucose, Bld 100 (*)    All other components within normal limits  HCG, QUANTITATIVE, PREGNANCY - Abnormal; Notable for the following:    hCG, Beta Chain, Quant, S 8,890 (*)    All other components within normal limits  URINALYSIS, ROUTINE W REFLEX MICROSCOPIC - Abnormal; Notable for the following:    Color, Urine STRAW (*)    Hgb urine dipstick SMALL (*)    Squamous Epithelial / LPF 0-5 (*)    All other components within normal limits  I-STAT BETA HCG BLOOD, ED ([facility], [facility], [facility] ONLY) - Abnormal;  Notable for the following:    I-stat hCG, quantitative >2,000.0 (*)    All other components within normal limits  CBC  PROTIME-INR  RAPID HIV SCREEN (HIV 1/2 AB+AG)  HIV ANTIBODY (ROUTINE TESTING)  GC/CHLAMYDIA PROBE AMP ([place]) NOT AT [facility]    EKG  EKG Interpretation None       Radiology [place] Ob Comp < 14 Wks  Result Date: 08/10/2016 CLINICAL DATA:  Bleeding since this morning.  Pelvic pain for 1 day EXAM: OBSTETRIC <14 WK US AND TRANSVAGINAL OB US TECHNIQUE: Both transabdominal and transvaginal ultrasound examinations were performed for complete evaluation of the gestation as well as the maternal uterus, adnexal regions, and pelvic cul-de-sac. Transvaginal technique was performed to assess early pregnancy. COMPARISON:  05/15/2016 FINDINGS: Intrauterine gestational sac: Single Yolk sac:  Visualized. Embryo:  Not Visualized. MSD: 4.9  mm   5 w   2  d Subchorionic hemorrhage: Small volume hypoechoic fluid along the lower gestational sac measuring 5 mm. Maternal uterus/adnexae: Corpus luteum on the right. Small simple pelvic fluid. Unremarkable left ovary. IMPRESSION: 1. Single intrauterine gestational sac measuring 5 weeks 2 days. Yolk sac without visible fetal pole. 2. 5 mm subchorionic hemorrhage/fluid along the lower gestational sac. 3. Small pelvic fluid around a right corpus luteum. Electronically Signed   By: Marnee SpringJonathon  Watts M.D.   On: 08/10/2016 09:39   [place] Ob Transvaginal  Result Date: 08/10/2016 CLINICAL DATA:  Bleeding since this morning.  Pelvic pain for 1 day EXAM: OBSTETRIC <14 WK US AND TRANSVAGINAL OB US TECHNIQUE: Both transabdominal and transvaginal ultrasound examinations were performed for complete evaluation of the gestation as well as the maternal uterus, adnexal regions, and pelvic cul-de-sac. Transvaginal technique was performed to assess early pregnancy. COMPARISON:  05/15/2016 FINDINGS: Intrauterine gestational sac: Single Yolk sac:  Visualized. Embryo:  Not  Visualized. MSD: 4.9  mm   5 w   2  d Subchorionic hemorrhage: Small volume hypoechoic fluid along the lower gestational sac measuring 5 mm. Maternal uterus/adnexae: Corpus luteum on the right. Small simple pelvic fluid. Unremarkable left ovary. IMPRESSION: 1. Single intrauterine gestational sac measuring 5 weeks 2 days. Yolk sac without visible fetal pole. 2. 5 mm subchorionic hemorrhage/fluid along the lower gestational sac. 3. Small pelvic fluid around a right corpus luteum. Electronically Signed   By: Marnee SpringJonathon  Watts M.D.   On: 08/10/2016 09:39    Procedures Procedures (including critical care time)  Medications Ordered in ED Medications - No data to display   Initial Impression / Assessment and Plan / ED Course  I have reviewed the triage vital signs and the nursing notes.  Pertinent labs & imaging results that were available during my care of the patient were reviewed by me and considered  in my medical decision making (see chart for details).     26 yo G2P1011 at estimated [redacted] wk GA here with minimal vaginal bleeding and lower abdominal/pelvic cramping. Pelvic exam shows scant bleeding, closed os. DDx implantation bleeding, SAB, UTI with cystitis. No evidence of incomplete or septic AB. Will check urine, labs, OB U/S. Per records, pt is O+ - no indication for Rhogam.  Lab work unremarkable. Hgb stable, INR normal. U/S shows small subchorionic hemorrhage with intrauterine gestation. Many WBCs on pelvic but no signs of other abnormality. UA without bacteriuria. Discussed findings w/ pt. She has outpt OB follow-up already arranged. Will start on prenatals, d/c with supportive care and outpt follow-up.  Final Clinical Impressions(s) / ED Diagnoses   Final diagnoses:  Subchorionic hematoma in first trimester, single or unspecified fetus     Shaune Pollack, MD 08/10/16 1018

## 2016-08-10 NOTE — ED Notes (Signed)
Unable to triage PT not in waiting area

## 2016-08-10 NOTE — ED Triage Notes (Addendum)
Pt [redacted] weeks pregnant tomorrow c/o low medial cramping abdominal pain, similar to her menstrual cramps, and scant brown vaginal bleeding noted on toilet paper only, does not soil underwear or require sanitary napkin.  Pt has not had any prenatal care yet, pregnancy confirmed by home pregnancy tests. Pt's second pregnancy, 1st pregnancy without complication, live birth, daughter healthy.

## 2016-08-10 NOTE — ED Notes (Signed)
US at bedside

## 2016-08-11 LAB — HIV ANTIBODY (ROUTINE TESTING W REFLEX): HIV Screen 4th Generation wRfx: NONREACTIVE

## 2016-08-13 LAB — GC/CHLAMYDIA PROBE AMP (~~LOC~~) NOT AT ARMC
CHLAMYDIA, DNA PROBE: NEGATIVE
Neisseria Gonorrhea: NEGATIVE

## 2016-09-02 ENCOUNTER — Inpatient Hospital Stay (HOSPITAL_COMMUNITY)
Admission: AD | Admit: 2016-09-02 | Discharge: 2016-09-02 | Disposition: A | Discharge status: Home / Self Care | Payer: Medicaid Other | Source: Ambulatory Visit | Attending: Obstetrics & Gynecology | Admitting: Obstetrics & Gynecology

## 2016-09-02 ENCOUNTER — Encounter (HOSPITAL_COMMUNITY): Payer: Self-pay | Admitting: *Deleted

## 2016-09-02 DIAGNOSIS — Z3A08 8 weeks gestation of pregnancy: Secondary | ICD-10-CM | POA: Insufficient documentation

## 2016-09-02 DIAGNOSIS — O210 Mild hyperemesis gravidarum: Secondary | ICD-10-CM

## 2016-09-02 LAB — URINALYSIS, ROUTINE W REFLEX MICROSCOPIC
Bilirubin Urine: NEGATIVE
GLUCOSE, UA: NEGATIVE mg/dL
HGB URINE DIPSTICK: NEGATIVE
Ketones, ur: NEGATIVE mg/dL
NITRITE: NEGATIVE
PH: 5 (ref 5.0–8.0)
PROTEIN: NEGATIVE mg/dL
Specific Gravity, Urine: 1.026 (ref 1.005–1.030)

## 2016-09-02 LAB — WET PREP, GENITAL
CLUE CELLS WET PREP: NONE SEEN
Sperm: NONE SEEN
Trich, Wet Prep: NONE SEEN
Yeast Wet Prep HPF POC: NONE SEEN

## 2016-09-02 LAB — CBC
HCT: 37.5 % (ref 36.0–46.0)
HEMOGLOBIN: 13.3 g/dL (ref 12.0–15.0)
MCH: 30 pg (ref 26.0–34.0)
MCHC: 35.5 g/dL (ref 30.0–36.0)
MCV: 84.7 fL (ref 78.0–100.0)
PLATELETS: 287 10*3/uL (ref 150–400)
RBC: 4.43 MIL/uL (ref 3.87–5.11)
RDW: 12.9 % (ref 11.5–15.5)
WBC: 8.7 10*3/uL (ref 4.0–10.5)

## 2016-09-02 MED ORDER — PROMETHAZINE HCL 25 MG PO TABS: 25.0000 mg | ORAL_TABLET | Freq: Four times a day (QID) | ORAL | 0 refills | Status: DC | PRN

## 2016-09-02 MED ORDER — PROMETHAZINE HCL 25 MG RE SUPP: 25.0000 mg | Freq: Four times a day (QID) | RECTAL | 0 refills | Status: DC | PRN

## 2016-09-02 MED ORDER — PROMETHAZINE HCL 25 MG/ML IJ SOLN
25.0000 mg | Freq: Once | INTRAVENOUS | Status: AC
  Administered 2016-09-02: 25 mg via INTRAVENOUS
  Filled 2016-09-02: qty 1

## 2016-09-02 NOTE — Discharge Instructions (Signed)
Get your medicine from the pharmacy and take as directed. Eat small amounts of food every 2-3 hours. No infection found today.  Labs pending. Likely having urinary frequency as the uterus is growing. Drink at least 8 8-oz glasses of water every day. [facility]. Return here if you have fever, worsening vomiting or vaginal bleeding and abdominal pain.

## 2016-09-02 NOTE — MAU Note (Signed)
+  vaginal burning since yesterday Yellow vaginal discharge  +increase urinary frequency  +nausea/vomiting States unable to tolerate food; throws up every time she eats Decrease appetite

## 2016-09-02 NOTE — MAU Provider Note (Signed)
History     CSN: [id number]  Arrival date and time: 09/02/16 1450   First Provider Initiated Contact with Patient 09/02/16 1518      Chief Complaint  Patient presents with  . Vaginal Discharge  . Abdominal Pain  . Emesis  . Nausea   HPI Amber Murphy 26 y.o. 1865w1d  Comes to MAU today with vomiting of all food and fluids.  Not able to keep anything down.  Also has lower abdominal burning and urinary frequency with small amounts of urine.  [facility].  OB History    Gravida Para Term Preterm AB Living   3 1 1  0 1 1   SAB TAB Ectopic Multiple Live Births   0 0 0 0 1      Past Medical History:  Diagnosis Date  . No pertinent past medical history     Past Surgical History:  Procedure Laterality Date  . NO PAST SURGERIES      Family History  Problem Relation Age of Onset  . Early death Father     Social History  Substance Use Topics  . Smoking status: Never Smoker  . Smokeless tobacco: Never Used  . Alcohol use No    Allergies: No Known Allergies  Prescriptions Prior to Admission  Medication Sig Dispense Refill Last Dose  . Prenatal MV-Min-Fe Fum-FA-DHA ([employer] WOMENS PRENATAL+DHA) 28-0.975 & 200 MG MISC Take 1 tablet by mouth daily. 60 each 0     Review of Systems  Constitutional: Negative for fever.  Gastrointestinal: Positive for nausea and vomiting.  Genitourinary: Positive for dysuria, frequency and vaginal discharge. Negative for flank pain and vaginal bleeding.   Physical Exam   Blood pressure 114/83, pulse 78, temperature (!) 97.5 F (36.4 C), temperature source Oral, resp. rate 16, weight 136 lb 1.9 oz (61.7 kg), last menstrual period 07/07/2016, SpO2 100 %, unknown if currently breastfeeding.  Physical Exam  Nursing note and vitals reviewed. Constitutional: She is oriented to person, place, and time. She appears well-developed and well-nourished.  HENT:  Head: Normocephalic.   Eyes: EOM are normal.  Neck: Neck supple.  GI: Soft.  Genitourinary:  Genitourinary Comments: Speculum exam: Vagina - Small amount of pale yellow discharge, no odor Cervix - No contact bleeding Bimanual exam: Cervix multiparous closed Uterus non tender, 8 week size Adnexa non tender, no masses bilaterally GC/Chlam, wet prep done Chaperone present for exam.   Musculoskeletal: Normal range of motion.  Neurological: She is alert and oriented to person, place, and time.  Skin: Skin is warm and dry.  Psychiatric: She has a normal mood and affect.    MAU Course  Procedures Results for orders placed or performed during the hospital encounter of 09/02/16 (from the past 24 hour(s))  Urinalysis, Routine w reflex microscopic     Status: Abnormal   Collection Time: 09/02/16  3:04 PM  Result Value Ref Range   Color, Urine AMBER (A) YELLOW   APPearance CLOUDY (A) CLEAR   Specific Gravity, Urine 1.026 1.005 - 1.030   pH 5.0 5.0 - 8.0   Glucose, UA NEGATIVE NEGATIVE mg/dL   Hgb urine dipstick NEGATIVE NEGATIVE   Bilirubin Urine NEGATIVE NEGATIVE   Ketones, ur NEGATIVE NEGATIVE mg/dL   Protein, ur NEGATIVE NEGATIVE mg/dL   Nitrite NEGATIVE NEGATIVE   Leukocytes, UA MODERATE (A) NEGATIVE   RBC / HPF 0-5 0 - 5 RBC/hpf   WBC, UA TOO NUMEROUS TO  COUNT 0 - 5 WBC/hpf   Bacteria, UA RARE (A) NONE SEEN   Squamous Epithelial / LPF 6-30 (A) NONE SEEN   Mucous PRESENT   CBC     Status: None   Collection Time: 09/02/16  3:26 PM  Result Value Ref Range   WBC 8.7 4.0 - 10.5 K/uL   RBC 4.43 3.87 - 5.11 MIL/uL   Hemoglobin 13.3 12.0 - 15.0 g/dL   HCT 16.137.5 09.636.0 - 04.546.0 %   MCV 84.7 78.0 - 100.0 fL   MCH 30.0 26.0 - 34.0 pg   MCHC 35.5 30.0 - 36.0 g/dL   RDW 40.912.9 81.111.5 - 91.415.5 %   Platelets 287 150 - 400 K/uL  Wet prep, genital     Status: Abnormal   Collection Time: 09/02/16  3:31 PM  Result Value Ref Range   Yeast Wet Prep HPF POC NONE SEEN NONE SEEN   Trich, Wet Prep NONE SEEN NONE SEEN    Clue Cells Wet Prep HPF POC NONE SEEN NONE SEEN   WBC, Wet Prep HPF POC FEW (A) NONE SEEN   Sperm NONE SEEN     MDM Given 1000cc LR with Phenergan 25 mg IV in MAU. Reviewed medications with client.  Advised to take medication and wait 30 minutes and then eat.   Will eprescribe phenergan tablets and suppositories to use if nausea continues. Previous notes reviewed - previous ultrasound documenting IUP.  Did not listen for FHT today as too early to hear baby's heart beat. Inhouse Interpreter used for all interactions with client.  Assessment and Plan  Morning sickness - unable to keep down food  Plan Get your medicine from the pharmacy and take as directed. No infection found today.  Labs pending. Likely having urinary frequency as the uterus is growing. Drink at least 8 8-oz glasses of water every day. [facility]. Return here if you have fever, worsening vomiting or vaginal bleeding and abdominal pain.  Krystian Ferrentino L [name] 09/02/2016, 3:24 PM

## 2016-09-03 LAB — GC/CHLAMYDIA PROBE AMP (~~LOC~~) NOT AT ARMC
CHLAMYDIA, DNA PROBE: NEGATIVE
Neisseria Gonorrhea: NEGATIVE

## 2016-09-17 ENCOUNTER — Encounter (HOSPITAL_COMMUNITY): Payer: Self-pay

## 2016-09-17 ENCOUNTER — Inpatient Hospital Stay (HOSPITAL_COMMUNITY): Payer: Self-pay

## 2016-09-17 ENCOUNTER — Inpatient Hospital Stay (HOSPITAL_COMMUNITY)
Admission: AD | Admit: 2016-09-17 | Discharge: 2016-09-17 | Disposition: A | Discharge status: Home / Self Care | Payer: Self-pay | Source: Ambulatory Visit | Attending: Obstetrics and Gynecology | Admitting: Obstetrics and Gynecology

## 2016-09-17 DIAGNOSIS — O210 Mild hyperemesis gravidarum: Secondary | ICD-10-CM | POA: Insufficient documentation

## 2016-09-17 DIAGNOSIS — Z3491 Encounter for supervision of normal pregnancy, unspecified, first trimester: Secondary | ICD-10-CM

## 2016-09-17 DIAGNOSIS — Z3A10 10 weeks gestation of pregnancy: Secondary | ICD-10-CM | POA: Insufficient documentation

## 2016-09-17 DIAGNOSIS — O4691 Antepartum hemorrhage, unspecified, first trimester: Secondary | ICD-10-CM | POA: Insufficient documentation

## 2016-09-17 DIAGNOSIS — O219 Vomiting of pregnancy, unspecified: Secondary | ICD-10-CM

## 2016-09-17 DIAGNOSIS — O209 Hemorrhage in early pregnancy, unspecified: Secondary | ICD-10-CM

## 2016-09-17 HISTORY — DX: Other specified health status: Z78.9

## 2016-09-17 LAB — URINALYSIS, ROUTINE W REFLEX MICROSCOPIC
BILIRUBIN URINE: NEGATIVE
GLUCOSE, UA: NEGATIVE mg/dL
Ketones, ur: 20 mg/dL — AB
NITRITE: NEGATIVE
PH: 5 (ref 5.0–8.0)
Protein, ur: 30 mg/dL — AB
SPECIFIC GRAVITY, URINE: 1.024 (ref 1.005–1.030)

## 2016-09-17 LAB — CBC
HCT: 39.5 % (ref 36.0–46.0)
Hemoglobin: 14.2 g/dL (ref 12.0–15.0)
MCH: 30 pg (ref 26.0–34.0)
MCHC: 35.9 g/dL (ref 30.0–36.0)
MCV: 83.3 fL (ref 78.0–100.0)
PLATELETS: 283 10*3/uL (ref 150–400)
RBC: 4.74 MIL/uL (ref 3.87–5.11)
RDW: 12.7 % (ref 11.5–15.5)
WBC: 8.4 10*3/uL (ref 4.0–10.5)

## 2016-09-17 MED ORDER — ONDANSETRON 4 MG PO TBDP: 4.0000 mg | ORAL_TABLET | Freq: Three times a day (TID) | ORAL | 0 refills | Status: DC | PRN

## 2016-09-17 MED ORDER — ONDANSETRON HCL 4 MG PO TABS: 4.0000 mg | ORAL_TABLET | Freq: Three times a day (TID) | ORAL | 0 refills | Status: DC | PRN

## 2016-09-17 MED ORDER — ONDANSETRON 8 MG PO TBDP
8.0000 mg | ORAL_TABLET | Freq: Once | ORAL | Status: AC
  Administered 2016-09-17: 8 mg via ORAL
  Filled 2016-09-17: qty 1

## 2016-09-17 NOTE — MAU Note (Signed)
YESTERDAY - AT NIGHT  - TO B-ROOM -  RED BLOOD ON UNDERWEAR-   PASSED GOLF-BALL SIZE  CLOT  THEN A LOT OF BLOOD.   BUT  TODAY AT 12 NOON BLEEDING  STOPPED.     CRAMPS -  AND LOOSE STOOLS   ALL DAY  AND  VOMITING.   TOOK MED FOR VOMITING LAST NIGHT - NONE  TODAY  .   NO   BLEEDING IN TRIAGE .

## 2016-09-17 NOTE — MAU Provider Note (Signed)
Chief Complaint: Vaginal Bleeding   First Provider Initiated Contact with Patient 09/17/16 2014      SUBJECTIVE HPI: Amber Murphy is a 26 y.o. G3P1011 at [redacted]w[redacted]d by LMP who presents to maternity admissions reporting heavy vaginal bleeding starting yesterday and lessening today. The bleeding was very heavy x 1 episode yesterday with blood in the toilet and large clots. Then, the bleeding tapered off and is much less today without clots.  She is still having light bleeding/spotting tonight. The bleeding is associated with menstrual-like cramping.  She has not tried any treatments. There are no other associated symptoms. She does report nausea with some occasional vomiting but this is unchanged since onset of bleeding. She is taking Phenergan for this but it is not helping. She denies vaginal itching/burning, urinary symptoms, h/a, dizziness, or fever/chills.     HPI  Past Medical History:  Diagnosis Date  . Medical history non-contributory   . No pertinent past medical history    Past Surgical History:  Procedure Laterality Date  . NO PAST SURGERIES     Social History   Social History  . Marital status: Single    Spouse name: N/A  . Number of children: N/A  . Years of education: N/A   Occupational History  . Not on file.   Social History Main Topics  . Smoking status: Never Smoker  . Smokeless tobacco: Never Used  . Alcohol use No  . Drug use: No  . Sexual activity: Not Currently    Birth control/ protection: None   Other Topics Concern  . Not on file   Social History Narrative   ** Merged History Encounter **       No current facility-administered medications on file prior to encounter.    Current Outpatient Prescriptions on File Prior to Encounter  Medication Sig Dispense Refill  . Prenatal MV-Min-Fe Fum-FA-DHA ([employer] WOMENS PRENATAL+DHA) 28-0.975 & 200 MG MISC Take 1 tablet by mouth daily. 60 each 0  . promethazine (PHENERGAN) 25 MG suppository Place 1  suppository (25 mg total) rectally every 6 (six) hours as needed for nausea or vomiting. 6 each 0  . promethazine (PHENERGAN) 25 MG tablet Take 1 tablet (25 mg total) by mouth every 6 (six) hours as needed for nausea or vomiting. Can use 1/2 tablet if needed. 30 tablet 0   No Known Allergies  ROS:  Review of Systems  Constitutional: Negative for chills, fatigue and fever.  Respiratory: Negative for shortness of breath.   Cardiovascular: Negative for chest pain.  Genitourinary: Positive for pelvic pain and vaginal bleeding. Negative for difficulty urinating, dysuria, flank pain, vaginal discharge and vaginal pain.  Neurological: Negative for dizziness and headaches.  Psychiatric/Behavioral: Negative.      I have reviewed patient's Past Medical Hx, Surgical Hx, Family Hx, Social Hx, medications and allergies.   Physical Exam   Patient Vitals for the past 24 hrs:  BP Temp Temp src Pulse Resp Height Weight  09/17/16 2214 114/83 - - - - - -  09/17/16 1941 109/72 98.5 F (36.9 C) Oral (!) 103 20 5' 2.5" (1.588 m) 129 lb 12 oz (58.9 kg)   Constitutional: Well-developed, well-nourished female in no acute distress.  Cardiovascular: normal rate Respiratory: normal effort GI: Abd soft, non-tender. Pos BS x 4 MS: Extremities nontender, no edema, normal ROM Neurologic: Alert and oriented x 4.  GU: Neg CVAT.  PELVIC EXAM:Deferred, negative cultures on 09/02/16  CNM unable to obtain FHR by doppler  LAB RESULTS  Results for orders placed or performed during the hospital encounter of 09/17/16 (from the past 24 hour(s))  Urinalysis, Routine w reflex microscopic     Status: Abnormal   Collection Time: 09/17/16  7:43 PM  Result Value Ref Range   Color, Urine AMBER (A) YELLOW   APPearance HAZY (A) CLEAR   Specific Gravity, Urine 1.024 1.005 - 1.030   pH 5.0 5.0 - 8.0   Glucose, UA NEGATIVE NEGATIVE mg/dL   Hgb urine dipstick MODERATE (A) NEGATIVE   Bilirubin Urine NEGATIVE NEGATIVE    Ketones, ur 20 (A) NEGATIVE mg/dL   Protein, ur 30 (A) NEGATIVE mg/dL   Nitrite NEGATIVE NEGATIVE   Leukocytes, UA LARGE (A) NEGATIVE   RBC / HPF 6-30 0 - 5 RBC/hpf   WBC, UA TOO NUMEROUS TO COUNT 0 - 5 WBC/hpf   Bacteria, UA FEW (A) NONE SEEN   Squamous Epithelial / LPF 6-30 (A) NONE SEEN   Mucus PRESENT   CBC     Status: None   Collection Time: 09/17/16  8:44 PM  Result Value Ref Range   WBC 8.4 4.0 - 10.5 K/uL   RBC 4.74 3.87 - 5.11 MIL/uL   Hemoglobin 14.2 12.0 - 15.0 g/dL   HCT 09.8 11.9 - 14.7 %   MCV 83.3 78.0 - 100.0 fL   MCH 30.0 26.0 - 34.0 pg   MCHC 35.9 30.0 - 36.0 g/dL   RDW 82.9 56.2 - 13.0 %   Platelets 283 150 - 400 K/uL       IMAGING US Ob Comp Less 14 Wks  Result Date: 09/17/2016 CLINICAL DATA:  Vaginal bleeding since yesterday. Past golf ball sized clot. Bleeding has stopped today at noon. EXAM: OBSTETRIC <14 WK ULTRASOUND TECHNIQUE: Transvaginal ultrasound was performed for complete evaluation of the gestation as well as the maternal uterus, adnexal regions, and pelvic cul-de-sac. COMPARISON:  08/10/2016 FINDINGS: Intrauterine gestational sac: Single Yolk sac:  Visualized. Embryo:  Visualized. Cardiac Activity: Visualized. Heart Rate: 171 bpm CRL:   38.4  mm   10 w 5 d                  [place] EDC: 04/10/2017 Subchorionic hemorrhage:  None visualized. Maternal uterus/adnexae: Corpus luteum on the right. Normal left ovary. No free fluid. IMPRESSION: 1. No perigestational hematoma identified. 2. Viable 10 week 5 day intrauterine gestation. Electronically Signed   By: Tollie Eth M.D.   On: 09/17/2016 21:11    MAU Management/MDM: Ordered labs and reviewed results.  Normal IUP on today's [place].  Reassurance provided to pt.  Pt to f/u with prenatal care as planned.  Bleeding precautions reviewed.  Urine sent for culture with WBCs but no nitrites, moderate hemoglobin.  Will treat nausea with Zofran ODT, reviewed risks associated with cleft lip/palate, pt is [redacted]w[redacted]d confirmed by [place]  so likely low risk so she agrees to Rx for Zofran.  Pt to continue Phenergan as prescribed.  Pt stable at time of discharge.  ASSESSMENT 1. Normal IUP (intrauterine pregnancy) on prenatal ultrasound, first trimester   2. Vaginal bleeding in pregnancy, first trimester   3. Nausea and vomiting during pregnancy prior to [redacted] weeks gestation     PLAN Discharge home Allergies as of 09/17/2016   No Known Allergies     Medication List    TAKE these medications   [employer] WOMENS PRENATAL+DHA 28-0.975 & 200 MG Misc Take 1 tablet by mouth daily.   ondansetron 4 MG disintegrating tablet Commonly known as:  ZOFRAN ODT Take 1 tablet (4 mg total) by mouth every 8 (eight) hours as needed for nausea.   ondansetron 4 MG tablet Commonly known as:  ZOFRAN Take 1 tablet (4 mg total) by mouth every 8 (eight) hours as needed for nausea or vomiting.   promethazine 25 MG tablet Commonly known as:  PHENERGAN Take 1 tablet (25 mg total) by mouth every 6 (six) hours as needed for nausea or vomiting. Can use 1/2 tablet if needed.   promethazine 25 MG suppository Commonly known as:  PHENERGAN Place 1 suppository (25 mg total) rectally every 6 (six) hours as needed for nausea or vomiting.      Follow-up Information    Department, [facility] Follow up.   Why:  As scheduled for prenatal care, return to MAU as needed for emergencies Contact information: [address] [place] [address] [phone]           Sharen Counter Certified Nurse-Midwife 09/18/2016  2:55 AM

## 2016-09-19 ENCOUNTER — Telehealth: Payer: Self-pay | Admitting: Advanced Practice Midwife

## 2016-09-19 MED ORDER — CEPHALEXIN 500 MG PO CAPS: 500.0000 mg | ORAL_CAPSULE | Freq: Four times a day (QID) | ORAL | 0 refills | Status: DC

## 2016-09-19 NOTE — Telephone Encounter (Signed)
Pt OB urine culture positive for UTI. Keflex QID sent to pt pharmacy. Left message for pt to return call about lab results.

## 2016-09-20 LAB — CULTURE, OB URINE: Culture: >=100000 — AB

## 2016-09-22 ENCOUNTER — Telehealth: Payer: Self-pay | Admitting: Medical

## 2016-09-22 NOTE — Telephone Encounter (Signed)
LM to return call to MAU for results. Patient has UTI. Rx for Keflex was sent in my MAU provider on 09/21/16.   Marny Lowenstein, PA-C 09/22/2016 8:50 AM

## 2016-09-24 ENCOUNTER — Encounter (HOSPITAL_COMMUNITY): Payer: Self-pay

## 2016-09-24 ENCOUNTER — Inpatient Hospital Stay (HOSPITAL_COMMUNITY)
Admission: AD | Admit: 2016-09-24 | Discharge: 2016-09-27 | DRG: 781 | Disposition: A | Discharge status: Home / Self Care | Payer: Medicaid Other | Source: Ambulatory Visit | Attending: Obstetrics & Gynecology | Admitting: Obstetrics & Gynecology

## 2016-09-24 DIAGNOSIS — R509 Fever, unspecified: Secondary | ICD-10-CM | POA: Diagnosis present

## 2016-09-24 DIAGNOSIS — O2301 Infections of kidney in pregnancy, first trimester: Secondary | ICD-10-CM | POA: Diagnosis present

## 2016-09-24 DIAGNOSIS — O210 Mild hyperemesis gravidarum: Secondary | ICD-10-CM | POA: Diagnosis present

## 2016-09-24 DIAGNOSIS — Z3A11 11 weeks gestation of pregnancy: Secondary | ICD-10-CM

## 2016-09-24 LAB — URINALYSIS, ROUTINE W REFLEX MICROSCOPIC
BILIRUBIN URINE: NEGATIVE
GLUCOSE, UA: NEGATIVE mg/dL
Ketones, ur: 20 mg/dL — AB
NITRITE: POSITIVE — AB
PH: 6 (ref 5.0–8.0)
Protein, ur: 100 mg/dL — AB
SPECIFIC GRAVITY, URINE: 1.017 (ref 1.005–1.030)

## 2016-09-24 LAB — TYPE AND SCREEN
ABO/RH(D): O POS
Antibody Screen: NEGATIVE

## 2016-09-24 LAB — CBC WITH DIFFERENTIAL/PLATELET
Basophils Absolute: 0 10*3/uL (ref 0.0–0.1)
Basophils Relative: 0 %
EOS ABS: 0 10*3/uL (ref 0.0–0.7)
EOS PCT: 0 %
HCT: 32.9 % — ABNORMAL LOW (ref 36.0–46.0)
HEMOGLOBIN: 11.9 g/dL — AB (ref 12.0–15.0)
LYMPHS ABS: 1.9 10*3/uL (ref 0.7–4.0)
Lymphocytes Relative: 18 %
MCH: 29.6 pg (ref 26.0–34.0)
MCHC: 36.2 g/dL — AB (ref 30.0–36.0)
MCV: 81.8 fL (ref 78.0–100.0)
MONOS PCT: 6 %
Monocytes Absolute: 0.7 10*3/uL (ref 0.1–1.0)
Neutro Abs: 8.3 10*3/uL — ABNORMAL HIGH (ref 1.7–7.7)
Neutrophils Relative %: 76 %
Platelets: 200 10*3/uL (ref 150–400)
RBC: 4.02 MIL/uL (ref 3.87–5.11)
RDW: 12.7 % (ref 11.5–15.5)
WBC: 10.9 10*3/uL — ABNORMAL HIGH (ref 4.0–10.5)

## 2016-09-24 MED ORDER — DOCUSATE SODIUM 100 MG PO CAPS
100.0000 mg | ORAL_CAPSULE | Freq: Every day | ORAL | Status: DC
  Administered 2016-09-25 – 2016-09-26 (×2): 100 mg via ORAL
  Filled 2016-09-24 (×4): qty 1

## 2016-09-24 MED ORDER — ACETAMINOPHEN 325 MG PO TABS
650.0000 mg | ORAL_TABLET | ORAL | Status: DC | PRN
  Administered 2016-09-24 – 2016-09-26 (×5): 650 mg via ORAL
  Filled 2016-09-24 (×5): qty 2

## 2016-09-24 MED ORDER — CALCIUM CARBONATE ANTACID 500 MG PO CHEW: 2.0000 | CHEWABLE_TABLET | ORAL | Status: DC | PRN

## 2016-09-24 MED ORDER — SODIUM CHLORIDE 0.9 % IV SOLN
INTRAVENOUS | Status: DC
  Administered 2016-09-24: 22:00:00 via INTRAVENOUS

## 2016-09-24 MED ORDER — PRENATAL MULTIVITAMIN CH
1.0000 | ORAL_TABLET | Freq: Every day | ORAL | Status: DC
  Administered 2016-09-25 – 2016-09-26 (×2): 1 via ORAL
  Filled 2016-09-24 (×3): qty 1

## 2016-09-24 MED ORDER — DEXTROSE 5 % IV SOLN
1.0000 g | INTRAVENOUS | Status: DC
  Administered 2016-09-24: 1 g via INTRAVENOUS
  Filled 2016-09-24 (×2): qty 10

## 2016-09-24 MED ORDER — ZOLPIDEM TARTRATE 5 MG PO TABS: 5.0000 mg | ORAL_TABLET | Freq: Every evening | ORAL | Status: DC | PRN

## 2016-09-24 MED ORDER — SODIUM CHLORIDE 0.9 % IV SOLN
25.0000 mg | Freq: Once | INTRAVENOUS | Status: AC
  Administered 2016-09-24: 25 mg via INTRAVENOUS
  Filled 2016-09-24: qty 1

## 2016-09-24 NOTE — MAU Note (Signed)
Pt here with c/o vomiting, headache, fever, back ache on right side. Denies any bleeding or cramping.

## 2016-09-24 NOTE — MAU Provider Note (Signed)
History     CSN: [id number]  Arrival date and time: 09/24/16 1859   First Provider Initiated Contact with Patient 09/24/16 2016      Chief Complaint  Patient presents with  . Fever  . Headache  . Emesis   HPI  Amber Murphy is a 26 yo G3P1011 at 11.[redacted] wks gestation presenting to MAU with complaints of vomiting, headache, fever, and RT side back pain x 4 days.  She was seen in MAU on 8/20  Past Medical History:  Diagnosis Date  . Medical history non-contributory   . No pertinent past medical history     Past Surgical History:  Procedure Laterality Date  . NO PAST SURGERIES      Family History  Problem Relation Age of Onset  . Early death Father     Social History  Substance Use Topics  . Smoking status: Never Smoker  . Smokeless tobacco: Never Used  . Alcohol use No    Allergies: No Known Allergies  Prescriptions Prior to Admission  Medication Sig Dispense Refill Last Dose  . cephALEXin (KEFLEX) 500 MG capsule Take 1 capsule (500 mg total) by mouth 4 (four) times daily. 28 capsule 0   . ondansetron (ZOFRAN ODT) 4 MG disintegrating tablet Take 1 tablet (4 mg total) by mouth every 8 (eight) hours as needed for nausea. 20 tablet 0 09/24/2016  . ondansetron (ZOFRAN) 4 MG tablet Take 1 tablet (4 mg total) by mouth every 8 (eight) hours as needed for nausea or vomiting. 20 tablet 0   . Prenatal MV-Min-Fe Fum-FA-DHA ([employer] WOMENS PRENATAL+DHA) 28-0.975 & 200 MG MISC Take 1 tablet by mouth daily. 60 each 0   . promethazine (PHENERGAN) 25 MG suppository Place 1 suppository (25 mg total) rectally every 6 (six) hours as needed for nausea or vomiting. 6 each 0   . promethazine (PHENERGAN) 25 MG tablet Take 1 tablet (25 mg total) by mouth every 6 (six) hours as needed for nausea or vomiting. Can use 1/2 tablet if needed. 30 tablet 0     Review of Systems  Constitutional: Negative.   HENT: Negative.   Eyes: Negative.   Respiratory: Negative.   Cardiovascular: Negative.    Gastrointestinal: Positive for nausea and vomiting.  Endocrine: Negative.   Genitourinary: Positive for dysuria.  Musculoskeletal: Positive for back pain.  Skin: Negative.   Allergic/Immunologic: Negative.   Neurological: Negative.   Hematological: Negative.   Psychiatric/Behavioral: Negative.    Physical Exam   Blood pressure 96/70, pulse (!) 115, temperature 99.5 F (37.5 C), temperature source Oral, resp. rate 20, height 5\' 2"  (1.575 m), weight 58.5 kg (129 lb), last menstrual period 07/07/2016, SpO2 99 %.  Physical Exam  Constitutional: She is oriented to person, place, and time. She appears well-developed and well-nourished.  HENT:  Head: Normocephalic.  Eyes: Pupils are equal, round, and reactive to light.  Neck: Normal range of motion.  Cardiovascular: Normal rate, regular rhythm and normal heart sounds.   Respiratory: Effort normal and breath sounds normal.  GI: Soft. Bowel sounds are decreased. There is tenderness in the right lower quadrant. There is CVA tenderness (RT side).  Genitourinary:  Genitourinary Comments: Pelvic deferred  Musculoskeletal: Normal range of motion.  Neurological: She is alert and oriented to person, place, and time.  Skin: Skin is warm and dry.  Psychiatric: She has a normal mood and affect. Her behavior is normal. Judgment and thought content normal.    MAU Course  Procedures  MDM CCUA  CBC with Diff IVF: Phenergan 25 mg in 0.9% NS 1000 ml bolus FHTs by doppler = 187 bpm  Results for orders placed or performed during the hospital encounter of 09/24/16 (from the past 24 hour(s))  Urinalysis, Routine w reflex microscopic     Status: Abnormal   Collection Time: 09/24/16  7:55 PM  Result Value Ref Range   Color, Urine AMBER (A) YELLOW   APPearance CLOUDY (A) CLEAR   Specific Gravity, Urine 1.017 1.005 - 1.030   pH 6.0 5.0 - 8.0   Glucose, UA NEGATIVE NEGATIVE mg/dL   Hgb urine dipstick MODERATE (A) NEGATIVE   Bilirubin Urine  NEGATIVE NEGATIVE   Ketones, ur 20 (A) NEGATIVE mg/dL   Protein, ur 401 (A) NEGATIVE mg/dL   Nitrite POSITIVE (A) NEGATIVE   Leukocytes, UA MODERATE (A) NEGATIVE   RBC / HPF 6-30 0 - 5 RBC/hpf   WBC, UA TOO NUMEROUS TO COUNT 0 - 5 WBC/hpf   Bacteria, UA FEW (A) NONE SEEN   Squamous Epithelial / LPF 6-30 (A) NONE SEEN   Mucus PRESENT    Assessment and Plan  Pyelonephritis affecting pregnancy in first trimester - Admit to HROB unit - Routine Antenatal Admission orders - Dr. Debroah Loop notified of admission   [name], MSN, CNM 09/24/2016, 8:26 PM

## 2016-09-25 DIAGNOSIS — Z3A11 11 weeks gestation of pregnancy: Secondary | ICD-10-CM

## 2016-09-25 DIAGNOSIS — O2301 Infections of kidney in pregnancy, first trimester: Principal | ICD-10-CM

## 2016-09-25 LAB — ABO/RH: ABO/RH(D): O POS

## 2016-09-25 MED ORDER — DEXTROSE 5 % IV SOLN
1.0000 g | Freq: Once | INTRAVENOUS | Status: AC
  Administered 2016-09-25: 1 g via INTRAVENOUS
  Filled 2016-09-25: qty 10

## 2016-09-25 MED ORDER — CYCLOBENZAPRINE HCL 5 MG PO TABS
5.0000 mg | ORAL_TABLET | Freq: Three times a day (TID) | ORAL | Status: DC | PRN
  Administered 2016-09-25: 5 mg via ORAL
  Filled 2016-09-25 (×2): qty 1

## 2016-09-25 MED ORDER — DEXTROSE 5 % IV SOLN
2.0000 g | INTRAVENOUS | Status: DC
  Administered 2016-09-25 – 2016-09-26 (×2): 2 g via INTRAVENOUS
  Filled 2016-09-25 (×2): qty 2

## 2016-09-25 NOTE — Progress Notes (Signed)
Significant other reports that he questions whether or not the patient is in as much pain as she says because "sometimes she exaggerates."  Explained to patient and SO that pain is subjective and she needs to be sure to tell us when she needs something for pain and when she's comfortable and re-explained pain scale and interventions.

## 2016-09-25 NOTE — Progress Notes (Signed)
Subjective:right flank pain. Did not sleep well Patient reports nausea and vomiting.  Flank pain. Appetite is better  Objective: I have reviewed patient's vital signs, intake and output, medications and labs.  General: alert, cooperative and no distress GI: soft, non-tender; bowel sounds normal; no masses,  no organomegaly and right flank tenderness not severe  Urinalysis    Component Value Date/Time   COLORURINE AMBER (A) 09/24/2016 1955   APPEARANCEUR CLOUDY (A) 09/24/2016 1955   LABSPEC 1.017 09/24/2016 1955   PHURINE 6.0 09/24/2016 1955   GLUCOSEU NEGATIVE 09/24/2016 1955   HGBUR MODERATE (A) 09/24/2016 1955   BILIRUBINUR NEGATIVE 09/24/2016 1955   KETONESUR 20 (A) 09/24/2016 1955   PROTEINUR 100 (A) 09/24/2016 1955   UROBILINOGEN 0.2 02/26/2011 1552   NITRITE POSITIVE (A) 09/24/2016 1955   LEUKOCYTESUR MODERATE (A) 09/24/2016 1955    CBC    Component Value Date/Time   WBC 10.9 (H) 09/24/2016 2046   RBC 4.02 09/24/2016 2046   HGB 11.9 (L) 09/24/2016 2046   HCT 32.9 (L) 09/24/2016 2046   PLT 200 09/24/2016 2046   MCV 81.8 09/24/2016 2046   MCH 29.6 09/24/2016 2046   MCHC 36.2 (H) 09/24/2016 2046   RDW 12.7 09/24/2016 2046   LYMPHSABS 1.9 09/24/2016 2046   MONOABS 0.7 09/24/2016 2046   EOSABS 0.0 09/24/2016 2046   BASOSABS 0.0 09/24/2016 2046     Assessment/Plan: Patient Active Problem List   Diagnosis Date Noted  . Pyelonephritis affecting pregnancy in first trimester 09/24/2016   [redacted]w[redacted]d Continue antibiotic IV  LOS: 1 day    Scheryl Darter 09/25/2016, 6:55 AM

## 2016-09-26 NOTE — H&P (Signed)
History   CSN: [id number]  Arrival date and time: 09/24/16 1859   First Provider Initiated Contact with Patient 09/24/16 2016         Chief Complaint  Patient presents with  . Fever  . Headache  . Emesis   HPI  [name] is a 26 yo G3P1011 at 11.[redacted] wks gestation presenting to MAU with complaints of vomiting, headache, fever, and RT side back pain x 4 days.  She was seen in MAU on 8/20      Past Medical History:  Diagnosis Date  . Medical history non-contributory   . No pertinent past medical history     Past Surgical History:  Procedure Laterality Date  . NO PAST SURGERIES           Family History  Problem Relation Age of Onset  . Early death Father         Social History  Substance Use Topics  . Smoking status: Never Smoker  . Smokeless tobacco: Never Used  . Alcohol use No    Allergies: No Known Allergies         Prescriptions Prior to Admission  Medication Sig Dispense Refill Last Dose  . cephALEXin (KEFLEX) 500 MG capsule Take 1 capsule (500 mg total) by mouth 4 (four) times daily. 28 capsule 0   . ondansetron (ZOFRAN ODT) 4 MG disintegrating tablet Take 1 tablet (4 mg total) by mouth every 8 (eight) hours as needed for nausea. 20 tablet 0 09/24/2016  . ondansetron (ZOFRAN) 4 MG tablet Take 1 tablet (4 mg total) by mouth every 8 (eight) hours as needed for nausea or vomiting. 20 tablet 0   . Prenatal MV-Min-Fe Fum-FA-DHA ([employer] WOMENS PRENATAL+DHA) 28-0.975 & 200 MG MISC Take 1 tablet by mouth daily. 60 each 0   . promethazine (PHENERGAN) 25 MG suppository Place 1 suppository (25 mg total) rectally every 6 (six) hours as needed for nausea or vomiting. 6 each 0   . promethazine (PHENERGAN) 25 MG tablet Take 1 tablet (25 mg total) by mouth every 6 (six) hours as needed for nausea or vomiting. Can use 1/2 tablet if needed. 30 tablet 0     Review of Systems  Constitutional: Negative.   HENT: Negative.   Eyes: Negative.    Respiratory: Negative.   Cardiovascular: Negative.   Gastrointestinal: Positive for nausea and vomiting.  Endocrine: Negative.   Genitourinary: Positive for dysuria.  Musculoskeletal: Positive for back pain.  Skin: Negative.   Allergic/Immunologic: Negative.   Neurological: Negative.   Hematological: Negative.   Psychiatric/Behavioral: Negative.    Physical Exam   Blood pressure 96/70, pulse (!) 115, temperature 99.5 F (37.5 C), temperature source Oral, resp. rate 20, height 5\' 2"  (1.575 m), weight 58.5 kg (129 lb), last menstrual period 07/07/2016, SpO2 99 %.  Physical Exam  Constitutional: She is oriented to person, place, and time. She appears well-developed and well-nourished.  HENT:  Head: Normocephalic.  Eyes: Pupils are equal, round, and reactive to light.  Neck: Normal range of motion.  Cardiovascular: Normal rate, regular rhythm and normal heart sounds.   Respiratory: Effort normal and breath sounds normal.  GI: Soft. Bowel sounds are decreased. There is tenderness in the right lower quadrant. There is CVA tenderness (RT side).  Genitourinary:  Genitourinary Comments: Pelvic deferred  Musculoskeletal: Normal range of motion.  Neurological: She is alert and oriented to person, place, and time.  Skin: Skin is warm and dry.  Psychiatric: She has a normal mood  and affect. Her behavior is normal. Judgment and thought content normal.    MAU Course  Procedures  MDM CCUA CBC with Diff IVF: Phenergan 25 mg in 0.9% NS 1000 ml bolus FHTs by doppler = 187 bpm  Lab Results Last 24 Hours       Results for orders placed or performed during the hospital encounter of 09/24/16 (from the past 24 hour(s))  Urinalysis, Routine w reflex microscopic     Status: Abnormal   Collection Time: 09/24/16  7:55 PM  Result Value Ref Range   Color, Urine AMBER (A) YELLOW   APPearance CLOUDY (A) CLEAR   Specific Gravity, Urine 1.017 1.005 - 1.030   pH 6.0 5.0 - 8.0    Glucose, UA NEGATIVE NEGATIVE mg/dL   Hgb urine dipstick MODERATE (A) NEGATIVE   Bilirubin Urine NEGATIVE NEGATIVE   Ketones, ur 20 (A) NEGATIVE mg/dL   Protein, ur 161 (A) NEGATIVE mg/dL   Nitrite POSITIVE (A) NEGATIVE   Leukocytes, UA MODERATE (A) NEGATIVE   RBC / HPF 6-30 0 - 5 RBC/hpf   WBC, UA TOO NUMEROUS TO COUNT 0 - 5 WBC/hpf   Bacteria, UA FEW (A) NONE SEEN   Squamous Epithelial / LPF 6-30 (A) NONE SEEN   Mucus PRESENT      Assessment and Plan  Pyelonephritis affecting pregnancy in first trimester - Admit to HROB unit - Routine Antenatal Admission orders - Dr. Debroah Loop notified of admission   [name], MSN, CNM 09/24/2016, 8:26 PM     Cosigned by: Adam Phenix, MD at 09/25/2016 6:43 AM

## 2016-09-26 NOTE — Progress Notes (Signed)
Patient ID: Amber Murphy, female   DOB: 11/30/1990, 26 y.o.   MRN: [id number]  FACULTY PRACTICE ANTEPARTUM(COMPREHENSIVE) NOTE  Amber Murphy is a 26 y.o. G3P1011 at 3814w0d  who is admitted for pyelonephritis in pregnancy.    Fetal presentation is unsure. Length of Stay:  2  Days  Date of admission:09/24/2016  Subjective: Patient reports some improvement in her flank pain. She denies any vaginal bleeding or abnormal discharge. She is tolerating breakfast. Nausea is improving  Vitals:  Blood pressure (!) 81/52, pulse 78, temperature 98.2 F (36.8 C), temperature source Oral, resp. rate 16, height 5\' 2"  (1.575 m), weight 129 lb (58.5 kg), last menstrual period 07/07/2016, SpO2 99 %, unknown if currently breastfeeding. Vitals:   09/25/16 2104 09/25/16 2300 09/26/16 0411 09/26/16 0800  BP:  (!) 82/47 92/60 (!) 81/52  Pulse:  91 89 78  Resp:  16 16 16   Temp: 98.4 F (36.9 C) 98.4 F (36.9 C) 98.7 F (37.1 C) 98.2 F (36.8 C)  TempSrc: Oral Oral Oral Oral  SpO2:  100% 100% 99%  Weight:      Height:       Physical Examination:  General appearance - alert, well appearing, and in no distress LUNGS: CTA b/l HEART: regular rate and rhythm Pelvic Exam:  not indicated Extremities: Homans sign is negative, no sign of DVT with DTRs 2+ bilaterally Membranes:intact  Fetal Monitoring:  + doppler  Labs:  No results found for this or any previous visit (from the past 24 hour(s)).  Imaging Studies:     Medications:  Scheduled . docusate sodium  100 mg Oral Daily  . prenatal multivitamin  1 tablet Oral Q1200   I have reviewed the patient's current medications.  ASSESSMENT: [id number] 6314w0d Estimated Date of Delivery: 04/10/17  Patient Active Problem List   Diagnosis Date Noted  . Pyelonephritis affecting pregnancy in first trimester 09/24/2016    PLAN: - Continue IV antibiotics until 48 hours afebrile (Patient with Tmax 100.7 at 8pm on 8/28) - encouraged oral intake and the  use of antiemetics - Continue current care  Davena Julian 09/26/2016,8:59 AM

## 2016-09-27 MED ORDER — CYCLOBENZAPRINE HCL 5 MG PO TABS: 5.0000 mg | ORAL_TABLET | Freq: Three times a day (TID) | ORAL | 0 refills | Status: DC | PRN

## 2016-09-27 MED ORDER — OXYCODONE-ACETAMINOPHEN 5-325 MG PO TABS: 1.0000 | ORAL_TABLET | Freq: Four times a day (QID) | ORAL | 0 refills | Status: DC | PRN

## 2016-09-27 MED ORDER — CEPHALEXIN 500 MG PO CAPS: 500.0000 mg | ORAL_CAPSULE | Freq: Four times a day (QID) | ORAL | 0 refills | Status: DC

## 2016-09-27 MED ORDER — DOCUSATE SODIUM 100 MG PO CAPS: 100.0000 mg | ORAL_CAPSULE | Freq: Every day | ORAL | 0 refills | Status: DC

## 2016-09-27 NOTE — Discharge Instructions (Signed)
Pregnancy and Urinary Tract Infection What is a urinary tract infection? A urinary tract infection (UTI) is an infection of any part of the urinary tract. This includes the kidneys, the tubes that connect your kidneys to your bladder (ureters), the bladder, and the tube that carries urine out of your body (urethra). These organs make, store, and get rid of urine in the body. A UTI can be a bladder infection (cystitis) or a kidney infection (pyelonephritis). This infection may be caused by fungi, viruses, and bacteria. Bacteria are the most common cause of UTIs. You are more likely to develop a UTI during pregnancy because:  The physical and hormonal changes your body goes through can make it easier for bacteria to get into your urinary tract.  Your growing baby puts pressure on your uterus and can affect urine flow.  Does a UTI place my baby at risk? An untreated UTI during pregnancy could lead to a kidney infection, which can cause health problems that could affect your baby. Possible complications of an untreated UTI include:  Having your baby before 37 weeks of pregnancy (premature).  Having a baby with a low birth weight.  Developing high blood pressure during pregnancy (preeclampsia).  What are the symptoms of a UTI? Symptoms of a UTI include:  Fever.  Frequent urination or passing small amounts of urine frequently.  Needing to urinate urgently.  Pain or a burning sensation with urination.  Urine that smells bad or unusual.  Cloudy urine.  Pain in the lower abdomen or back.  Trouble urinating.  Blood in the urine.  Vomiting or being less hungry than normal.  Diarrhea or abdominal pain.  Vaginal discharge.  What are the treatment options for a UTI during pregnancy? Treatment for this condition may include:  Antibiotic medicines that are safe to take during pregnancy.  Other medicines to treat less common causes of UTI.  How can I prevent a UTI?  To prevent a  UTI:  Go to the bathroom as soon as you feel the need.  Always wipe from front to back.  Wash your genital area with soap and warm water daily.  Empty your bladder before and after sex.  Wear cotton underwear.  Limit your intake of high sugar foods or drinks, such as regular soda, juice, and sweets.  Drink 6-8 glasses of water daily.  Do not wear tight-fitting pants.  Do not douche or use deodorant sprays.  Do not drink alcohol, caffeine, or carbonated drinks. These can irritate the bladder.  Contact a health care provider if:  Your symptoms do not improve or get worse.  You have a fever after two days of treatment.  You have a rash.  You have abnormal vaginal discharge.  You have back or side pain.  You have chills.  You have nausea and vomiting. Get help right away if: Seek immediate medical care if you are pregnant and:  You feel contractions in your uterus.  You have lower belly pain.  You have a gush of fluid from your vagina.  You have blood in your urine.  You are vomiting and cannot keep down any medicines or water.  This information is not intended to replace advice given to you by your health care provider. Make sure you discuss any questions you have with your health care provider. Document Released: 05/12/2010 Document Revised: 12/30/2015 Document Reviewed: 12/06/2014 Elsevier Interactive Patient Education  2017 Elsevier Inc.   Vanetta MuldersEmbarazo e infeccin urinaria (Pregnancy and Urinary Tract Infection) QU  ES UNA INFECCIN URINARIA? Una infeccin urinaria (IU) puede ocurrir en Corporate treasurer de las vas [place]. Estas incluyen los riones, los tubos que [employer] riones con la vejiga (urteres), la vejiga y el tubo por el que se elimina la orina del cuerpo (uretra). Estos rganos fabrican, Barrister's clerk y eliminan la orina del organismo. La IU puede ser una infeccin de la vejiga (cistitis) o una infeccin de los riones (pielonefritis). Esta  infeccin puede deberse a hongos, virus o bacterias. Las bacterias son las causas ms comunes de las IU. Es ms probable presentar una IU durante el embarazo por estas razones:  Los cambios fsicos y hormonales por los que atraviesa el cuerpo pueden hacer que sea ms fcil que las bacterias ingresen en las vas urinarias.  El feto en desarrollo hace presin sobre el tero y puede afectar el flujo de [place]. LA IU PONE EN RIESGO AL BEB? Una IU no tratada Academic librarian podra ocasionar una infeccin en los riones, lo que puede causar problemas de salud que afecten al beb. Algunas de las complicaciones posibles de una IU no tratada son las siguientes:  Tener al beb antes de las 37semanas de Psychiatrist (prematuro).  Tener un beb con bajo peso al nacer.  Presentar hipertensin arterial durante el embarazo (preeclampsia). CULES SON LOS SNTOMAS DE LA IU? Entre los sntomas de una IU, se incluyen los siguientes:  [place].  Miccin frecuente o eliminacin de pequeas cantidades de orina con frecuencia.  Necesidad urgente de Geographical information systems officer.  Sensacin de ardor o dolor al [employer].  Orina con mal olor u olor atpico.  [place] turbia.  Dolor en la parte baja del abdomen o en la espalda.  Dificultad para orinar.  Sangre en la orina.  Vmitos o ms apetito de lo normal.  Diarrea o dolor abdominal.  Tiene secrecin de flujo vaginal. CULES SON LAS OPCIONES DE TRATAMIENTO PARA LA IU DURANTE EL EMBARAZO? El tratamiento de esta afeccin puede incluir lo siguiente:  Antibiticos cuyo uso es seguro durante el Psychiatrist.  Otros medicamentos para tratar las causas menos frecuentes de infeccin urinaria. CMO PUEDO PREVENIR UNA IU? Para prevenir la IU, haga lo siguiente:  Vaya al bao en cuanto sienta la necesidad de [place].  Siempre debe limpiarse desde adelante hacia atrs.  Lvese el rea genital con agua tibia y [employer].  Vaciar la vejiga antes y despus de Comptroller.  Use ropa interior de algodn.  Limite el consumo de alimentos y bebidas con alto contenido de azcar, como gaseosas comunes, jugos y [place].  Beba de 6a 8vasos de Warehouse manager.  No use pantalones ajustados.  No se haga duchas vaginales ni use desodorantes en aerosol.  No tome alcohol, cafena ni bebidas gaseosas. Estas sustancias pueden irritar la vejiga. CUNDO DEBO BUSCAR ATENCIN MDICA? Solicite atencin mdica si:  Los sntomas no mejoran o empeoran.  Tiene fiebre despus de [address]  Tiene una erupcin cutnea.  Tiene flujo vaginal anormal.  Siente dolor en la espalda o en el costado.  Tiene escalofros.  Tiene nuseas y vmitos. CUNDO DEBO BUSCAR ASISTENCIA MDICA INMEDIATA? Solicite atencin mdica de inmediato si est embarazada y le sucede lo siguiente:  Siente Scientist, clinical (histocompatibility and immunogenetics).  Siente dolor en la parte inferior del abdomen.  Tiene una prdida de lquido por la vagina.  Observa sangre en la orina.  Tiene vmitos y no puede tragar medicamentos ni agua. Esta informacin no tiene Theme park manager el consejo del  mdico. Asegrese de hacerle al mdico cualquier pregunta que tenga. Document Released: 10/10/2011 Document Revised: 05/09/2015 Document Reviewed: 12/06/2014 Elsevier Interactive Patient Education  2017 [employer].

## 2016-09-27 NOTE — Discharge Summary (Signed)
OB Discharge Summary     Patient Name: Amber NuttingKristell M Murphy DOB: 26-Feb-1990 MRN: [id number]  Date of admission: 09/24/2016 Delivering MD: This patient has no babies on file.  Date of discharge: 09/27/2016  Admitting diagnosis: 10WKS VOMITTING 4DAYS FEVER HEADACHE Intrauterine pregnancy: 573w1d     Secondary diagnosis:  Active Problems:   Pyelonephritis affecting pregnancy in first trimester     Discharge diagnosis: pyelonephritis                                   Hospital course:  Patient admitted with nausea/emesis, fever/chills and back pain. Patient with untreated UTI since 8/20. Patient admitted for inpatient treatment of pyelonephritis in first trimester of pregnancy. Over the course of her hospitalization, she remained afebrile. She tolerated po intake. She ambulated and her back pain improved. She was found stable for discharge on HD#3  Physical exam  Vitals:   09/26/16 2000 09/27/16 0000 09/27/16 0430 09/27/16 0800  BP: 92/64 90/64 90/70  (!) 87/58  Pulse: 93 84 81 90  Resp: 18 18 18 18   Temp: 98.1 F (36.7 C) 97.8 F (36.6 C) 98.1 F (36.7 C) 97.8 F (36.6 C)  TempSrc: Oral Oral  Oral  SpO2: 100% 100% 100% 100%  Weight:      Height:       General: alert, cooperative and no distress Abdomen: soft, NT Back: no CVA tenderness DVT Evaluation: No evidence of DVT seen on physical exam. Negative Homan's sign. Labs: Lab Results  Component Value Date   WBC 10.9 (H) 09/24/2016   HGB 11.9 (L) 09/24/2016   HCT 32.9 (L) 09/24/2016   MCV 81.8 09/24/2016   PLT 200 09/24/2016   CMP Latest Ref Rng & Units 08/10/2016  Glucose 65 - 99 mg/dL 409(W100(H)  BUN 6 - 20 mg/dL 10  Creatinine 1.190.44 - 1.471.00 mg/dL 8.290.50  Sodium 562135 - 130145 mmol/L 137  Potassium 3.5 - 5.1 mmol/L 3.5  Chloride 101 - 111 mmol/L 107  CO2 22 - 32 mmol/L 22  Calcium 8.9 - 10.3 mg/dL 8.9  Total Protein 6.5 - 8.1 g/dL 7.4  Total Bilirubin 0.3 - 1.2 mg/dL 1.0  Alkaline Phos 38 - 126 U/L 54  AST 15 - 41 U/L 19   ALT 14 - 54 U/L 18    Discharge instruction: per After Visit Summary and "Baby and Me Booklet".  After visit meds:  Allergies as of 09/27/2016   No Known Allergies     Medication List    STOP taking these medications   ondansetron 4 MG disintegrating tablet Commonly known as:  ZOFRAN ODT   ondansetron 4 MG tablet Commonly known as:  ZOFRAN   promethazine 25 MG suppository Commonly known as:  PHENERGAN   promethazine 25 MG tablet Commonly known as:  PHENERGAN     TAKE these medications   cephALEXin 500 MG capsule Commonly known as:  KEFLEX Take 1 capsule (500 mg total) by mouth 4 (four) times daily.   [employer] WOMENS PRENATAL+DHA 28-0.975 & 200 MG Misc Take 1 tablet by mouth daily.   cyclobenzaprine 5 MG tablet Commonly known as:  FLEXERIL Take 1 tablet (5 mg total) by mouth 3 (three) times daily as needed for muscle spasms.   docusate sodium 100 MG capsule Commonly known as:  COLACE Take 1 capsule (100 mg total) by mouth daily.   oxyCODONE-acetaminophen 5-325 MG tablet Commonly known as:  PERCOCET/ROXICET Take  1-2 tablets by mouth every 6 (six) hours as needed.            Discharge Care Instructions        Start     Ordered   09/27/16 0000  cephALEXin (KEFLEX) 500 MG capsule  4 times daily     09/27/16 0857   09/27/16 0000  cyclobenzaprine (FLEXERIL) 5 MG tablet  3 times daily PRN     09/27/16 0857   09/27/16 0000  docusate sodium (COLACE) 100 MG capsule  Daily     09/27/16 0857   09/27/16 0000  oxyCODONE-acetaminophen (PERCOCET/ROXICET) 5-325 MG tablet  Every 6 hours PRN     09/27/16 0857      Diet: routine diet  Activity: Advance as tolerated. Pelvic rest for 6 weeks.   Outpatient follow up: Follow up as scheduled on 9/13 for routine prenatal care Follow up Appt:No future appointments. Follow up Visit:No Follow-up on file.   09/27/2016 Amber Antigua, MD

## 2016-11-13 ENCOUNTER — Other Ambulatory Visit (INDEPENDENT_AMBULATORY_CARE_PROVIDER_SITE_OTHER): Payer: Self-pay

## 2016-11-13 DIAGNOSIS — Z3482 Encounter for supervision of other normal pregnancy, second trimester: Secondary | ICD-10-CM

## 2016-11-13 LAB — POCT URINALYSIS DIP (MANUAL ENTRY)
Bilirubin, UA: NEGATIVE
Glucose, UA: NEGATIVE mg/dL
Nitrite, UA: NEGATIVE
PH UA: 6 (ref 5.0–8.0)
RBC UA: NEGATIVE
Spec Grav, UA: >=1.03 — AB (ref 1.010–1.025)
Urobilinogen, UA: 0.2 E.U./dL

## 2016-11-13 LAB — POCT UA - MICROSCOPIC ONLY: Epithelial cells, urine per micros: >20

## 2016-11-14 LAB — OBSTETRIC PANEL, INCLUDING HIV
Antibody Screen: NEGATIVE
BASOS: 0 %
Basophils Absolute: 0 10*3/uL (ref 0.0–0.2)
EOS (ABSOLUTE): 0 10*3/uL (ref 0.0–0.4)
EOS: 0 %
HEMATOCRIT: 35.4 % (ref 34.0–46.6)
HEMOGLOBIN: 11.8 g/dL (ref 11.1–15.9)
HEP B S AG: NEGATIVE
HIV Screen 4th Generation wRfx: NONREACTIVE
Immature Grans (Abs): 0 10*3/uL (ref 0.0–0.1)
Immature Granulocytes: 0 %
LYMPHS ABS: 1.5 10*3/uL (ref 0.7–3.1)
Lymphs: 17 %
MCH: 29.8 pg (ref 26.6–33.0)
MCHC: 33.3 g/dL (ref 31.5–35.7)
MCV: 89 fL (ref 79–97)
MONOCYTES: 3 %
Monocytes Absolute: 0.3 10*3/uL (ref 0.1–0.9)
NEUTROS ABS: 6.6 10*3/uL (ref 1.4–7.0)
Neutrophils: 80 %
Platelets: 243 10*3/uL (ref 150–379)
RBC: 3.96 x10E6/uL (ref 3.77–5.28)
RDW: 15.9 % — ABNORMAL HIGH (ref 12.3–15.4)
RH TYPE: POSITIVE
RPR: NONREACTIVE
RUBELLA: 6.89 {index} (ref >0.99)
WBC: 8.4 10*3/uL (ref 3.4–10.8)

## 2016-11-14 LAB — SICKLE CELL SCREEN: SICKLE CELL SCREEN: NEGATIVE

## 2016-11-15 LAB — CULTURE, OB URINE

## 2016-11-15 LAB — URINE CULTURE, OB REFLEX

## 2016-11-16 ENCOUNTER — Telehealth: Payer: Self-pay | Admitting: Family Medicine

## 2016-11-16 MED ORDER — CEPHALEXIN 500 MG PO CAPS: 500.0000 mg | ORAL_CAPSULE | Freq: Four times a day (QID) | ORAL | 0 refills | Status: AC

## 2016-11-16 NOTE — Telephone Encounter (Signed)
LM for Amber Murphy regarding UTI on lab results, sent in rx for keflex. She has appointment for new OB appointment Monday. Reminded her of this.

## 2016-11-19 ENCOUNTER — Encounter: Payer: Self-pay | Admitting: Family Medicine

## 2016-11-19 ENCOUNTER — Ambulatory Visit (INDEPENDENT_AMBULATORY_CARE_PROVIDER_SITE_OTHER): Payer: Self-pay | Admitting: Family Medicine

## 2016-11-19 ENCOUNTER — Other Ambulatory Visit (HOSPITAL_COMMUNITY): Admission: RE | Admit: 2016-11-19 | Payer: MEDICAID | Source: Ambulatory Visit | Admitting: Family Medicine

## 2016-11-19 VITALS — BP 96/54 | HR 79 | Temp 98.1°F | Wt 132.0 lb

## 2016-11-19 DIAGNOSIS — Z3493 Encounter for supervision of normal pregnancy, unspecified, third trimester: Secondary | ICD-10-CM | POA: Insufficient documentation

## 2016-11-19 DIAGNOSIS — Z3492 Encounter for supervision of normal pregnancy, unspecified, second trimester: Secondary | ICD-10-CM

## 2016-11-19 DIAGNOSIS — Z3A19 19 weeks gestation of pregnancy: Secondary | ICD-10-CM

## 2016-11-19 MED ORDER — METRONIDAZOLE 500 MG PO TABS: 500.0000 mg | ORAL_TABLET | Freq: Two times a day (BID) | ORAL | 0 refills | Status: AC

## 2016-11-19 NOTE — Progress Notes (Signed)
  Amber NuttingKristell M Murphy is a 26 y.o. yo G3P1011 at 3750w5d who presents for her initial prenatal visit. She is an [facility] patient. She gave urine sample on 10/16 for new OB labs and had UTI. Rx for Keflex called in, patient has been taking these. She has hx of pyelonephritis earlier in pregnancy requiring admission to hospital and IV antibiotics. Pregnancy is not planned She reports no symptoms.. She  is taking PNV. See flow sheet for details.  PMH, POBH, FH, meds, allergies and Social Hx reviewed.  Prenatal Exam:  Gen: Well nourished, well developed.  No distress.  Vitals noted. HEENT: Normocephalic, atraumatic.  Neck supple without cervical lymphadenopathy, thyromegaly or thyroid nodules.  Fair dentition. CV: RRR no murmur, gallops or rubs Lungs: CTAB.  Normal respiratory effort without wheezes or rales. Abd: soft, NTND. +BS.  Uterus not appreciated above pelvis. GU: Normal external female genitalia without lesions.  Normal vaginal, well rugated without lesions. No vaginal discharge.  Bimanual exam: No adnexal mass or TTP. No CMT.  Uterus size appropriate for GA. Ext: No clubbing, cyanosis or edema. Psych: Normal grooming and dress.  Not depressed or anxious appearing.  Normal thought content and process without flight of ideas or looseness of associations.  Assessment & Plan:  1) 26 y.o. yo G3P1011 at 4850w5d via early ultrasound doing well.   Current pregnancy issues include current UTI, hx of pyelonephritis during this pregnancy.  Dating is reliable. Confirmed by first trimester US. Prenatal labs reviewed, notable for UTI (being treating w/ Keflex), anemia of pregnancy. Genetic screening offered: patient will get Quad screen today. Will schedule anatomy scan for patient today. Gc/chlamydia collected today. Wet prep demonstrates clue cells, patient asymptomatic but due to risk of preterm birth with BV in pregnancy will treat with Flagyl for 7 day course. Early glucola is not  indicated.  PHQ-9 and Pregnancy Medical Home forms completed and reviewed.  Bleeding and pain precautions reviewed. Importance of prenatal vitamins reviewed.  Advised patient to get flu shot at local pharmacy as this will be more cost effective. Follow up in 4 weeks.  Dolores PattyAngela Mirl Hillery, DO PGY-2, [facility] 11/19/2016 11:00 AM

## 2016-11-19 NOTE — Patient Instructions (Addendum)
It was great seeing you today!  Please get a flu shot at your local pharmacy.  We will schedule you for your anatomy US. We will call you with this appointment day and time.   Please return for your next OB visit in 4 weeks.   If you have questions or concerns please do not hesitate to call at [phone].  Dolores PattyAngela Gaylyn Berish, DO PGY-2, [facility] 11/19/2016 9:53 AM     Second Trimester of Pregnancy The second trimester is from week 13 through week 28, month 4 through 6. This is often the time in pregnancy that you feel your best. Often times, morning sickness has lessened or quit. You may have more energy, and you may get hungry more often. Your unborn baby (fetus) is growing rapidly. At the end of the sixth month, he or she is about 9 inches long and weighs about 1 pounds. You will likely feel the baby move (quickening) between 18 and 20 weeks of pregnancy. Follow these instructions at home:  Avoid all smoking, herbs, and alcohol. Avoid drugs not approved by your doctor.  Do not use any tobacco products, including cigarettes, chewing tobacco, and electronic cigarettes. If you need help quitting, ask your doctor. You may get counseling or other support to help you quit.  Only take medicine as told by your doctor. Some medicines are safe and some are not during pregnancy.  Exercise only as told by your doctor. Stop exercising if you start having cramps.  Eat regular, healthy meals.  Wear a good support bra if your breasts are tender.  Do not use hot tubs, steam rooms, or saunas.  Wear your seat belt when driving.  Avoid raw meat, uncooked cheese, and liter boxes and soil used by cats.  Take your prenatal vitamins.  Take 1500-2000 milligrams of calcium daily starting at the 20th week of pregnancy until you deliver your baby.  Try taking medicine that helps you poop (stool softener) as needed, and if your doctor approves. Eat more fiber by eating fresh fruit,  vegetables, and whole grains. Drink enough fluids to keep your pee (urine) clear or pale yellow.  Take warm water baths (sitz baths) to soothe pain or discomfort caused by hemorrhoids. Use hemorrhoid cream if your doctor approves.  If you have puffy, bulging veins (varicose veins), wear support hose. Raise (elevate) your feet for 15 minutes, 3-4 times a day. Limit salt in your diet.  Avoid heavy lifting, wear low heals, and sit up straight.  Rest with your legs raised if you have leg cramps or low back pain.  Visit your dentist if you have not gone during your pregnancy. Use a soft toothbrush to brush your teeth. Be gentle when you floss.  You can have sex (intercourse) unless your doctor tells you not to.  Go to your doctor visits. Get help if:  You feel dizzy.  You have mild cramps or pressure in your lower belly (abdomen).  You have a nagging pain in your belly area.  You continue to feel sick to your stomach (nauseous), throw up (vomit), or have watery poop (diarrhea).  You have bad smelling fluid coming from your vagina.  You have pain with peeing (urination). Get help right away if:  You have a fever.  You are leaking fluid from your vagina.  You have spotting or bleeding from your vagina.  You have severe belly cramping or pain.  You lose or gain weight rapidly.  You have trouble catching  your breath and have chest pain.  You notice sudden or extreme puffiness (swelling) of your face, hands, ankles, feet, or legs.  You have not felt the baby move in over an hour.  You have severe headaches that do not go away with medicine.  You have vision changes. This information is not intended to replace advice given to you by your health care provider. Make sure you discuss any questions you have with your health care provider. Document Released: 04/11/2009 Document Revised: 06/23/2015 Document Reviewed: 03/18/2012 Elsevier Interactive Patient Education  2017 [employer].

## 2016-11-20 LAB — CERVICOVAGINAL ANCILLARY ONLY
Chlamydia: NEGATIVE
NEISSERIA GONORRHEA: NEGATIVE

## 2016-11-22 ENCOUNTER — Encounter: Payer: Self-pay | Admitting: Family Medicine

## 2016-11-27 ENCOUNTER — Telehealth: Payer: Self-pay

## 2016-11-27 NOTE — Telephone Encounter (Signed)
[facility] called and said that the patient called her and said that she has been waiting for her [facility] appointment to [facility]  Please give the patient a call when the appointment is made and she will need an interpreter. Thanks.Glennie HawkSimpson, Michelle R

## 2016-11-27 NOTE — Telephone Encounter (Signed)
Contacted pt and informed of US, 11/5 @1pm  [facility]. Pt voiced understanding.

## 2016-12-10 ENCOUNTER — Encounter: Payer: Self-pay | Admitting: Family Medicine

## 2016-12-18 ENCOUNTER — Encounter: Payer: Self-pay | Admitting: Family Medicine

## 2016-12-24 ENCOUNTER — Encounter: Payer: Self-pay | Admitting: Family Medicine

## 2016-12-24 ENCOUNTER — Ambulatory Visit (INDEPENDENT_AMBULATORY_CARE_PROVIDER_SITE_OTHER): Payer: Self-pay | Admitting: Family Medicine

## 2016-12-24 ENCOUNTER — Other Ambulatory Visit: Payer: Self-pay

## 2016-12-24 ENCOUNTER — Telehealth: Payer: Self-pay | Admitting: *Deleted

## 2016-12-24 VITALS — BP 100/64 | HR 86 | Temp 97.5°F | Wt 140.0 lb

## 2016-12-24 DIAGNOSIS — Z3492 Encounter for supervision of normal pregnancy, unspecified, second trimester: Secondary | ICD-10-CM

## 2016-12-24 LAB — POCT WET PREP (WET MOUNT)
Clue Cells Wet Prep Whiff POC: NEGATIVE
TRICHOMONAS WET PREP HPF POC: ABSENT

## 2016-12-24 NOTE — Progress Notes (Signed)
  Amber DikeKristell Murphy is a 26 y.o. G3P1011 at 3051w5d here for routine follow up.  She reports itchy "rash" in vagina.  See flow sheet for details.  A/P: Pregnancy at 7351w5d.  Doing well.  Currently with vaginal discharge and itching. Pregnancy issues include hx of UTI x 2 w/ pyelonephritis in early pregnancy, anemia of pregnancy.   Anatomy scan reviewed, problems are not noted.   Vaginal discharge- will do wet prep, anticipate this will show yeast. Will treat w/ monistat.   Hx of UTI/pyelonephritis- will repeat urine culture today  Flu shot not received yet- patient reports she will get this done at pharmacy  Preterm labor precautions reviewed. Follow up 4 weeks with me. Will do 1 hour GTT at next visit.  Dolores PattyAngela Tamarius Rosenfield, DO PGY-2, [facility] 12/24/2016 8:37 AM

## 2016-12-24 NOTE — Patient Instructions (Addendum)
Please get your flu shot at your local pharmacy.  Please come at 1:30 on 12/19 (Wednesday) to get your sugar test done. Your appointment is at 2:30 with Dr. Nancy MarusMayo.  If you have questions or concerns please do not hesitate to call at [phone].  Dolores PattyAngela Mayzee Reichenbach, DO PGY-2, [facility] 12/24/2016 9:10 AM   Second Trimester of Pregnancy The second trimester is from week 14 through week 27 (months 4 through 6). The second trimester is often a time when you feel your best. Your body has adjusted to being pregnant, and you begin to feel better physically. Usually, morning sickness has lessened or quit completely, you may have more energy, and you may have an increase in appetite. The second trimester is also a time when the fetus is growing rapidly. At the end of the sixth month, the fetus is about 9 inches long and weighs about 1 pounds. You will likely begin to feel the baby move (quickening) between 16 and 20 weeks of pregnancy. Body changes during your second trimester Your body continues to go through many changes during your second trimester. The changes vary from woman to woman.  Your weight will continue to increase. You will notice your lower abdomen bulging out.  You may begin to get stretch marks on your hips, abdomen, and breasts.  You may develop headaches that can be relieved by medicines. The medicines should be approved by your health care provider.  You may urinate more often because the fetus is pressing on your bladder.  You may develop or continue to have heartburn as a result of your pregnancy.  You may develop constipation because certain hormones are causing the muscles that push waste through your intestines to slow down.  You may develop hemorrhoids or swollen, bulging veins (varicose veins).  You may have back pain. This is caused by: ? Weight gain. ? Pregnancy hormones that are relaxing the joints in your pelvis. ? A shift in weight and the  muscles that support your balance.  Your breasts will continue to grow and they will continue to become tender.  Your gums may bleed and may be sensitive to brushing and flossing.  Dark spots or blotches (chloasma, mask of pregnancy) may develop on your face. This will likely fade after the baby is born.  A dark line from your belly button to the pubic area (linea nigra) may appear. This will likely fade after the baby is born.  You may have changes in your hair. These can include thickening of your hair, rapid growth, and changes in texture. Some women also have hair loss during or after pregnancy, or hair that feels dry or thin. Your hair will most likely return to normal after your baby is born.  What to expect at prenatal visits During a routine prenatal visit:  You will be weighed to make sure you and the fetus are growing normally.  Your blood pressure will be taken.  Your abdomen will be measured to track your baby's growth.  The fetal heartbeat will be listened to.  Any test results from the previous visit will be discussed.  Your health care provider may ask you:  How you are feeling.  If you are feeling the baby move.  If you have had any abnormal symptoms, such as leaking fluid, bleeding, severe headaches, or abdominal cramping.  If you are using any tobacco products, including cigarettes, chewing tobacco, and electronic cigarettes.  If you have any questions.  Other tests  that may be performed during your second trimester include:  Blood tests that check for: ? Low iron levels (anemia). ? High blood sugar that affects pregnant women (gestational diabetes) between 3324 and 28 weeks. ? Rh antibodies. This is to check for a protein on red blood cells (Rh factor).  Urine tests to check for infections, diabetes, or protein in the urine.  An ultrasound to confirm the proper growth and development of the baby.  An amniocentesis to check for possible genetic  problems.  Fetal screens for spina bifida and Down syndrome.  HIV (human immunodeficiency virus) testing. Routine prenatal testing includes screening for HIV, unless you choose not to have this test.  Follow these instructions at home: Medicines  Follow your health care provider's instructions regarding medicine use. Specific medicines may be either safe or unsafe to take during pregnancy.  Take a prenatal vitamin that contains at least 600 micrograms (mcg) of folic acid.  If you develop constipation, try taking a stool softener if your health care provider approves. Eating and drinking  Eat a balanced diet that includes fresh fruits and vegetables, whole grains, good sources of protein such as meat, eggs, or tofu, and low-fat dairy. Your health care provider will help you determine the amount of weight gain that is right for you.  Avoid raw meat and uncooked cheese. These carry germs that can cause birth defects in the baby.  If you have low calcium intake from food, talk to your health care provider about whether you should take a daily calcium supplement.  Limit foods that are high in fat and processed sugars, such as fried and sweet foods.  To prevent constipation: ? Drink enough fluid to keep your urine clear or pale yellow. ? Eat foods that are high in fiber, such as fresh fruits and vegetables, whole grains, and beans. Activity  Exercise only as directed by your health care provider. Most women can continue their usual exercise routine during pregnancy. Try to exercise for 30 minutes at least 5 days a week. Stop exercising if you experience uterine contractions.  Avoid heavy lifting, wear low heel shoes, and practice good posture.  A sexual relationship may be continued unless your health care provider directs you otherwise. Relieving pain and discomfort  Wear a good support bra to prevent discomfort from breast tenderness.  Take warm sitz baths to soothe any pain or  discomfort caused by hemorrhoids. Use hemorrhoid cream if your health care provider approves.  Rest with your legs elevated if you have leg cramps or low back pain.  If you develop varicose veins, wear support hose. Elevate your feet for 15 minutes, 3-4 times a day. Limit salt in your diet. Prenatal Care  Write down your questions. Take them to your prenatal visits.  Keep all your prenatal visits as told by your health care provider. This is important. Safety  Wear your seat belt at all times when driving.  Make a list of emergency phone numbers, including numbers for family, friends, the hospital, and police and fire departments. General instructions  Ask your health care provider for a referral to a local prenatal education class. Begin classes no later than the beginning of month 6 of your pregnancy.  Ask for help if you have counseling or nutritional needs during pregnancy. Your health care provider can offer advice or refer you to specialists for help with various needs.  Do not use hot tubs, steam rooms, or saunas.  Do not douche or use tampons  or scented sanitary pads.  Do not cross your legs for long periods of time.  Avoid cat litter boxes and soil used by cats. These carry germs that can cause birth defects in the baby and possibly loss of the fetus by miscarriage or stillbirth.  Avoid all smoking, herbs, alcohol, and unprescribed drugs. Chemicals in these products can affect the formation and growth of the baby.  Do not use any products that contain nicotine or tobacco, such as cigarettes and e-cigarettes. If you need help quitting, ask your health care provider.  Visit your dentist if you have not gone yet during your pregnancy. Use a soft toothbrush to brush your teeth and be gentle when you floss. Contact a health care provider if:  You have dizziness.  You have mild pelvic cramps, pelvic pressure, or nagging pain in the abdominal area.  You have persistent  nausea, vomiting, or diarrhea.  You have a bad smelling vaginal discharge.  You have pain when you urinate. Get help right away if:  You have a fever.  You are leaking fluid from your vagina.  You have spotting or bleeding from your vagina.  You have severe abdominal cramping or pain.  You have rapid weight gain or weight loss.  You have shortness of breath with chest pain.  You notice sudden or extreme swelling of your face, hands, ankles, feet, or legs.  You have not felt your baby move in over an hour.  You have severe headaches that do not go away when you take medicine.  You have vision changes. Summary  The second trimester is from week 14 through week 27 (months 4 through 6). It is also a time when the fetus is growing rapidly.  Your body goes through many changes during pregnancy. The changes vary from woman to woman.  Avoid all smoking, herbs, alcohol, and unprescribed drugs. These chemicals affect the formation and growth your baby.  Do not use any tobacco products, such as cigarettes, chewing tobacco, and e-cigarettes. If you need help quitting, ask your health care provider.  Contact your health care provider if you have any questions. Keep all prenatal visits as told by your health care provider. This is important. This information is not intended to replace advice given to you by your health care provider. Make sure you discuss any questions you have with your health care provider. Document Released: 01/09/2001 Document Revised: 06/23/2015 Document Reviewed: 03/18/2012 Elsevier Interactive Patient Education  2017 [employer].

## 2016-12-24 NOTE — Telephone Encounter (Signed)
-----   Message from Tillman SersAngela C Riccio, DO sent at 12/24/2016 10:38 AM EST -----  Please let ms Murias-Sanchez know her swab today showed some yeast which is likely causing her itching symptoms. She can get over the counter monostat and use this as directed. Thank you.

## 2016-12-24 NOTE — Telephone Encounter (Signed)
Patient informed of results and instructed to pick up OTC medication.  Patient voiced understanding. Tanzie Rothschild,CMA

## 2016-12-26 LAB — URINE CULTURE, OB REFLEX

## 2016-12-26 LAB — CULTURE, OB URINE

## 2017-01-14 ENCOUNTER — Encounter: Payer: Self-pay | Admitting: Family Medicine

## 2017-01-16 ENCOUNTER — Encounter: Payer: Self-pay | Admitting: Internal Medicine

## 2017-01-16 ENCOUNTER — Other Ambulatory Visit: Payer: Self-pay

## 2017-01-16 ENCOUNTER — Inpatient Hospital Stay (HOSPITAL_COMMUNITY)
Admission: AD | Admit: 2017-01-16 | Discharge: 2017-01-16 | Disposition: A | Discharge status: Home / Self Care | Payer: Medicaid Other | Source: Ambulatory Visit | Attending: Family Medicine | Admitting: Family Medicine

## 2017-01-16 ENCOUNTER — Encounter (HOSPITAL_COMMUNITY): Payer: Self-pay | Admitting: *Deleted

## 2017-01-16 ENCOUNTER — Ambulatory Visit (INDEPENDENT_AMBULATORY_CARE_PROVIDER_SITE_OTHER): Payer: Self-pay | Admitting: Internal Medicine

## 2017-01-16 VITALS — BP 100/60 | HR 88 | Temp 98.6°F | Wt 138.0 lb

## 2017-01-16 DIAGNOSIS — O9989 Other specified diseases and conditions complicating pregnancy, childbirth and the puerperium: Secondary | ICD-10-CM

## 2017-01-16 DIAGNOSIS — M549 Dorsalgia, unspecified: Secondary | ICD-10-CM | POA: Insufficient documentation

## 2017-01-16 DIAGNOSIS — R109 Unspecified abdominal pain: Secondary | ICD-10-CM

## 2017-01-16 DIAGNOSIS — Z3A28 28 weeks gestation of pregnancy: Secondary | ICD-10-CM | POA: Insufficient documentation

## 2017-01-16 DIAGNOSIS — N898 Other specified noninflammatory disorders of vagina: Secondary | ICD-10-CM

## 2017-01-16 DIAGNOSIS — O26893 Other specified pregnancy related conditions, third trimester: Secondary | ICD-10-CM | POA: Insufficient documentation

## 2017-01-16 DIAGNOSIS — Z3492 Encounter for supervision of normal pregnancy, unspecified, second trimester: Secondary | ICD-10-CM

## 2017-01-16 LAB — URINALYSIS, ROUTINE W REFLEX MICROSCOPIC
BILIRUBIN URINE: NEGATIVE
Glucose, UA: NEGATIVE mg/dL
Hgb urine dipstick: NEGATIVE
KETONES UR: NEGATIVE mg/dL
Nitrite: NEGATIVE
Protein, ur: NEGATIVE mg/dL
SPECIFIC GRAVITY, URINE: 1.012 (ref 1.005–1.030)
pH: 6 (ref 5.0–8.0)

## 2017-01-16 LAB — POCT WET PREP (WET MOUNT)
CLUE CELLS WET PREP WHIFF POC: NEGATIVE
Trichomonas Wet Prep HPF POC: ABSENT

## 2017-01-16 LAB — POCT 1 HR PRENATAL GLUCOSE: GLUCOSE 1 HR PRENATAL, POC: 139 mg/dL

## 2017-01-16 MED ORDER — CLOTRIMAZOLE 1 % VA CREA: 1.0000 | TOPICAL_CREAM | Freq: Every day | VAGINAL | 0 refills | Status: DC

## 2017-01-16 MED ORDER — CYCLOBENZAPRINE HCL 5 MG PO TABS: 10.0000 mg | ORAL_TABLET | Freq: Three times a day (TID) | ORAL | 0 refills | Status: DC | PRN

## 2017-01-16 NOTE — Patient Instructions (Addendum)
It was so nice to meet you!  Your baby girl looks great today. You are still having a yeast infection. I have prescribed a different yeast medication. Please use this at nighttime for 7 days.  We did some routine labs today and I will call you with these results.  We will see you back in 2 weeks!  -Dr. Nancy MarusMayo

## 2017-01-16 NOTE — Progress Notes (Signed)
Amber DikeKristell Murphy is a 26 y.o. G3P1011 at 6781w0d here for routine follow up.  She reports no contractions, no vaginal bleeding, no leakage of fluids. She endorses good fetal movement. She is also having yellow vaginal discharge for the last month. She tried OTC monistat, which hasn't helped. She also has vaginal itchiness. See flow sheet for details.  A/P: Pregnancy at 3181w0d.  Doing well.   Pregnancy issues include: 1. History of pyelonephritis in 1st trimester- repeat urine culture 11/26 just showed mixed urogenital flora. 2. Seen in the [facility] ~12 hours prior to this appointment with back pain and was prescribed Flexeril. Had a UA done that showed moderate leukocytes and negative nitrites. Urine culture pending. Back pain improving. 3. Vaginal discharge and itchiness- has persisted despite treatment with OTC monistat. Wet prep with few yeast today. Will treat with Clotrimazole vaginal cream qhs x 7 days  Infant feeding choice: breast Contraception choice: PCPs Infant circumcision desired not applicable  Tdap was not given today. [facility] ([facility] is [facility]) 1 hour glucola, CBC, RPR, and HIV were done today.   Pregnancy medical home and PHQ-9 forms were done today and reviewed.   Rh status was reviewed and patient does not need Rhogam.  Rhogam was not given today.   Preterm labor and fetal movement precautions reviewed. Follow up 2 weeks.

## 2017-01-16 NOTE — MAU Note (Signed)
Pt presents to [facility]U c/o abdmonial pain and back pain since 4oclock 01/15/17. Pt denies vaginal bleeding or LOF and reports good FM.

## 2017-01-16 NOTE — MAU Provider Note (Signed)
History  CSN: [id number] Arrival date and time: 01/16/17 0000  First Provider Initiated Contact with Patient 01/16/17 0040      Chief Complaint  Patient presents with  . Abdominal Pain    HPI: Amber Murphy is a 26 y.o. G3P1011 with IUP at 3722w0d who presents to maternity admissions reporting lower abdominal and back pain since around 4 pm. Has been persistent, but sine arrival here pain has improved significantly. Has not tried taking anything for this. Denies contractions, leakage of fluid or vaginal bleeding. Good fetal movement.  Also denies any abnormal vaginal discharge, vaginal bleeding, fevers, chills, malaise, dysuria, hematuria, urinary frequency, nausea, vomiting, diarrhea, RUQ/epigastric pain, dizziness/lighreadhess, or headache.   [facility]. Pregnancy complicated by pyelonephritis in the first trimester.   OB History  Gravida Para Term Preterm AB Living  3 1 1  0 1 1  SAB TAB Ectopic Multiple Live Births  0 0 0 0 1    # Outcome Date GA Lbr Len/2nd Weight Sex Delivery Anes PTL Lv  3 Current           2 AB 2016          1 Term 03/10/11 3178w5d 05:23 / 01:16 8 lb 0.9 oz (3.654 kg) F Vag-Spont Local  LIV     Birth Comments: WNL     Past Medical History:  Diagnosis Date  . Medical history non-contributory   . No pertinent past medical history    Past Surgical History:  Procedure Laterality Date  . NO PAST SURGERIES     Family History  Problem Relation Age of Onset  . Early death Father    Social History   Socioeconomic History  . Marital status: Single    Spouse name: Not on file  . Number of children: Not on file  . Years of education: Not on file  . Highest education level: Not on file  Social Needs  . Financial resource strain: Not on file  . Food insecurity - worry: Not on file  . Food insecurity - inability: Not on file  . Transportation needs - medical: Not on file  . Transportation needs - non-medical: Not on file   Occupational History  . Not on file  Tobacco Use  . Smoking status: Never Smoker  . Smokeless tobacco: Never Used  Substance and Sexual Activity  . Alcohol use: No  . Drug use: No  . Sexual activity: Not Currently    Birth control/protection: None  Other Topics Concern  . Not on file  Social History Narrative   ** Merged History Encounter **       No Known Allergies  Medications Prior to Admission  Medication Sig Dispense Refill Last Dose  . Prenatal MV-Min-Fe Fum-FA-DHA ([employer] WOMENS PRENATAL+DHA) 28-0.975 & 200 MG MISC Take 1 tablet by mouth daily. 60 each 0 01/15/2017 at Unknown time  . cyclobenzaprine (FLEXERIL) 5 MG tablet Take 1 tablet (5 mg total) by mouth 3 (three) times daily as needed for muscle spasms. 30 tablet 0 Unknown at Unknown time  . docusate sodium (COLACE) 100 MG capsule Take 1 capsule (100 mg total) by mouth daily. 10 capsule 0 Unknown at Unknown time  . oxyCODONE-acetaminophen (PERCOCET/ROXICET) 5-325 MG tablet Take 1-2 tablets by mouth every 6 (six) hours as needed. 10 tablet 0 Unknown at Unknown time    I have reviewed patient's Past Medical Hx, Surgical Hx, Family Hx, Social Hx, medications and allergies.   Review of Systems: Negative except  for what is mentioned in HPI.  Physical Exam   Blood pressure 99/61, pulse 76, temperature 97.6 F (36.4 C), temperature source Oral, resp. rate 16, height 5\' 2"  (1.575 m), last menstrual period 07/07/2016, unknown if currently breastfeeding.  Constitutional: Well-developed, well-nourished female in no acute distress.  HENT: [place]/AT, normal oropharynx mucosa. MMM Eyes: normal conjunctivae, no scleral icterus Cardiovascular: normal rate, regular rhythm Respiratory: normal effort, lungs CTAB.  GI: Abd soft, non-tender, gravid appropriate for gestational age.   GU: Neg CVAT bilaterally SVE: closed/thick/high MSK: Extremities nontender, no edema Neurologic: Alert and oriented x 4. Psych: Normal mood and  affect Skin: warm and dry   FHT:  Baseline 145 , moderate variability, accelerations present, no decelerations Toco: no contractions  MAU Course/MDM:   Nursing notes and VS reviewed. Patient seen and examined, as noted above.  Benign exam. Reactive NST  Results reviewed:  Results for orders placed or performed during the hospital encounter of 01/16/17  Urinalysis, Routine w reflex microscopic  Result Value Ref Range   Color, Urine YELLOW YELLOW   APPearance HAZY (A) CLEAR   Specific Gravity, Urine 1.012 1.005 - 1.030   pH 6.0 5.0 - 8.0   Glucose, UA NEGATIVE NEGATIVE mg/dL   Hgb urine dipstick NEGATIVE NEGATIVE   Bilirubin Urine NEGATIVE NEGATIVE   Ketones, ur NEGATIVE NEGATIVE mg/dL   Protein, ur NEGATIVE NEGATIVE mg/dL   Nitrite NEGATIVE NEGATIVE   Leukocytes, UA MODERATE (A) NEGATIVE   RBC / HPF 0-5 0 - 5 RBC/hpf   WBC, UA 0-5 0 - 5 WBC/hpf   Bacteria, UA RARE (A) NONE SEEN   Squamous Epithelial / LPF 0-5 (A) NONE SEEN   Mucus PRESENT    Amorphous Crystal PRESENT    Overall pain improved since arrival. Does not want something for pain now, but will d/c home on flexeril.  Will send urine for culture given hx of pyelonephritis this pregnancy, but no urinary symptoms at this time. Discussed return precautions.   Assessment and Plan  Assessment: 1. Back pain in pregnancy     Plan: --Discharge home in stable condition.  --Return precautions --F/u regular PNV scheduled for tomorrow    Eron Goble, Kandra NicolasJulie P, MD 01/16/2017 12:54 AM

## 2017-01-16 NOTE — Discharge Instructions (Signed)

## 2017-01-17 ENCOUNTER — Other Ambulatory Visit (INDEPENDENT_AMBULATORY_CARE_PROVIDER_SITE_OTHER): Payer: Self-pay

## 2017-01-17 DIAGNOSIS — Z3492 Encounter for supervision of normal pregnancy, unspecified, second trimester: Secondary | ICD-10-CM

## 2017-01-17 LAB — CBC
HEMATOCRIT: 34.7 % (ref 34.0–46.6)
HEMOGLOBIN: 12 g/dL (ref 11.1–15.9)
MCH: 30.2 pg (ref 26.6–33.0)
MCHC: 34.6 g/dL (ref 31.5–35.7)
MCV: 87 fL (ref 79–97)
Platelets: 256 10*3/uL (ref 150–379)
RBC: 3.97 x10E6/uL (ref 3.77–5.28)
RDW: 13 % (ref 12.3–15.4)
WBC: 10 10*3/uL (ref 3.4–10.8)

## 2017-01-17 LAB — RPR: RPR: NONREACTIVE

## 2017-01-17 LAB — HIV ANTIBODY (ROUTINE TESTING W REFLEX): HIV Screen 4th Generation wRfx: NONREACTIVE

## 2017-01-17 LAB — CULTURE, OB URINE: Culture: NO GROWTH

## 2017-01-17 LAB — POCT CBG (FASTING - GLUCOSE)-MANUAL ENTRY: GLUCOSE FASTING, POC: 88 mg/dL (ref 70–99)

## 2017-01-18 LAB — GESTATIONAL GLUCOSE TOLERANCE
Glucose, Fasting: 76 mg/dL (ref 65–94)
Glucose, GTT - 1 Hour: 157 mg/dL (ref 65–179)
Glucose, GTT - 2 Hour: 109 mg/dL (ref 65–154)
Glucose, GTT - 3 Hour: 89 mg/dL (ref 65–139)

## 2017-01-29 NOTE — L&D Delivery Note (Signed)
Delivery Note At 6:06 PM a viable female was delivered via  (Presentation:LOA ).  APGAR: 9, 9; weight pending .   Placenta status: complete,schultz .  Cord: short  with the following complications:fetal bradycardia just prior to delivery. Excellent maternal pushing effort.  Cord pH: NA  Anesthesia:  Epidural/local  Episiotomy:  none Lacerations: 2nd degree  Suture Repair: vicryl 4.0 Est. Blood Loss (mL):  100 cc  Mom to postpartum.  Baby to Couplet care / Skin to Skin.  Thressa ShellerHeather Hogan 04/06/2017, 6:29 PM

## 2017-02-06 ENCOUNTER — Telehealth: Payer: Self-pay | Admitting: *Deleted

## 2017-02-06 NOTE — Telephone Encounter (Signed)
LM for patient to call back.  Will continue to try and reach her to schedule this appointment. Jazmin Hartsell,CMA

## 2017-02-06 NOTE — Telephone Encounter (Signed)
-----   Message from Tillman SersAngela C Riccio, DO sent at 02/06/2017  9:10 AM EST ----- Regarding: need appt in OB clinic  Was reviewing [name]'s chart, she was due to follow up in OB clinic around 1st week of January but did not get appointment. She is adopt-a-mom, unsure if there was confusion in making her follow up but she should be seen in OB clinic as she is in her 3rd trimester already. Please let me know if there is difficulty in scheduling this, thanks!

## 2017-02-07 NOTE — Telephone Encounter (Signed)
LM for patient to call back. Nilesh Stegall,CMA  

## 2017-02-12 NOTE — Telephone Encounter (Signed)
Called and scheduled patient an appt with OB Clinic for 02/14/17 at 9:00 am.  Altamese Dilling~Hitoshi Werts, BSN, RN-BC

## 2017-02-12 NOTE — Telephone Encounter (Signed)
Tried calling patient again.  Printed letter and mailed it to patient. Amber Murphy,CMA

## 2017-02-12 NOTE — Telephone Encounter (Signed)
Please continue to try to reach this patient for her next OB visit.

## 2017-02-14 ENCOUNTER — Ambulatory Visit (INDEPENDENT_AMBULATORY_CARE_PROVIDER_SITE_OTHER): Payer: Self-pay | Admitting: Family Medicine

## 2017-02-14 ENCOUNTER — Other Ambulatory Visit: Payer: Self-pay

## 2017-02-14 ENCOUNTER — Encounter: Payer: Self-pay | Admitting: Family Medicine

## 2017-02-14 VITALS — BP 100/50 | HR 88 | Temp 98.3°F | Wt 144.0 lb

## 2017-02-14 DIAGNOSIS — Z3493 Encounter for supervision of normal pregnancy, unspecified, third trimester: Secondary | ICD-10-CM

## 2017-02-14 MED ORDER — TETANUS-DIPHTH-ACELL PERTUSSIS 5-2-15.5 LF-MCG/0.5 IM SUSP: 0.5000 mL | Freq: Once | INTRAMUSCULAR | 0 refills | Status: AC

## 2017-02-14 MED ORDER — INFLUENZA VAC SPLIT QUAD 0.5 ML IM SUSP: 0.5000 mL | Freq: Once | INTRAMUSCULAR | 0 refills | Status: AC

## 2017-02-14 NOTE — Patient Instructions (Signed)

## 2017-02-14 NOTE — Progress Notes (Addendum)
Amber Murphy is a 27 y.o. G3P1011 at 3756w1d for routine follow up.  She reports:No concern.  See flow sheet for details.  A/P: Pregnancy at 2156w1d.  Doing well.   Pregnancy issues include: No concern.  Infant feeding choice: Both breastfeed and bottle feed. Contraception choice: OCP Infant circumcision desired not applicable  Tdapwas not given today. I gave her Rx for Tdap and Influenza vaccine. She will take these to the [facility]. GBS/GC/CZ testing was not performed today. PAP recommended. Since she has Adopt-[employer]-Mom she might need to pay out of pocket. Patient declined PAP today. I gave her [facility] information at the [facility]. They might be able to offer her free PAP screening.  PCP to readdress at next visit. Note: Her FH measured 29.5cm today approximately 30 cm. We will pay attention to this during subsequent follow-up. If FH remains low, > neg 2cm, plan to do a growth US. Patient agreed with plan.  Preterm labor precautions reviewed. Safe sleep discussed. Kick counts reviewed. Follow up 2 weeks.

## 2017-02-27 ENCOUNTER — Ambulatory Visit (INDEPENDENT_AMBULATORY_CARE_PROVIDER_SITE_OTHER): Payer: Self-pay | Admitting: Family Medicine

## 2017-02-27 ENCOUNTER — Other Ambulatory Visit: Payer: Self-pay

## 2017-02-27 VITALS — BP 110/60 | HR 79 | Temp 98.3°F | Wt 144.0 lb

## 2017-02-27 DIAGNOSIS — Z3493 Encounter for supervision of normal pregnancy, unspecified, third trimester: Secondary | ICD-10-CM

## 2017-02-27 MED ORDER — TETANUS-DIPHTH-ACELL PERTUSSIS 5-2.5-18.5 LF-MCG/0.5 IM SUSP: 0.5000 mL | Freq: Once | INTRAMUSCULAR | 0 refills | Status: AC

## 2017-02-27 MED ORDER — INFLUENZA VAC A&B SURF ANT ADJ 0.5 ML IM SUSY: 0.5000 mL | PREFILLED_SYRINGE | Freq: Once | INTRAMUSCULAR | 0 refills | Status: AC

## 2017-02-27 NOTE — Progress Notes (Signed)
  Amber DikeKristell Murphy is a 27 y.o. G3P1011 at 151w0d here for routine follow up.  She reports some left groin pain.. See flow sheet for details.  A/P: Pregnancy at 761w0d.  Doing well.   Pregnancy issues include groin pain which upon exam is likely pubic symphysis pain. Reassured patient.  Infant feeding choice: try breast first, then maybe bottle Contraception choice: depo  Infant circumcision desired: not applicable  Tdap was not given today. [facility] Also gave another printed prescription for flu vaccine. Encouraged she get these vaccinations..  Preterm labor and fetal movement precautions reviewed. Safe sleep discussed. Follow up 2 weeks.  Dolores PattyAngela Sammie Denner, DO PGY-2, [facility] 02/27/2017 11:44 AM

## 2017-02-27 NOTE — Progress Notes (Signed)
Left lower groin pain x 1 week.

## 2017-02-27 NOTE — Patient Instructions (Signed)
Third Trimester of Pregnancy The third trimester is from week 28 through week 40 (months 7 through 9). The third trimester is a time when the unborn baby (fetus) is growing rapidly. At the end of the ninth month, the fetus is about 20 inches in length and weighs 6-10 pounds. Body changes during your third trimester Your body will continue to go through many changes during pregnancy. The changes vary from woman to woman. During the third trimester:  Your weight will continue to increase. You can expect to gain 25-35 pounds (11-16 kg) by the end of the pregnancy.  You may begin to get stretch marks on your hips, abdomen, and breasts.  You may urinate more often because the fetus is moving lower into your pelvis and pressing on your bladder.  You may develop or continue to have heartburn. This is caused by increased hormones that slow down muscles in the digestive tract.  You may develop or continue to have constipation because increased hormones slow digestion and cause the muscles that push waste through your intestines to relax.  You may develop hemorrhoids. These are swollen veins (varicose veins) in the rectum that can itch or be painful.  You may develop swollen, bulging veins (varicose veins) in your legs.  You may have increased body aches in the pelvis, back, or thighs. This is due to weight gain and increased hormones that are relaxing your joints.  You may have changes in your hair. These can include thickening of your hair, rapid growth, and changes in texture. Some women also have hair loss during or after pregnancy, or hair that feels dry or thin. Your hair will most likely return to normal after your baby is born.  Your breasts will continue to grow and they will continue to become tender. A yellow fluid (colostrum) may leak from your breasts. This is the first milk you are producing for your baby.  Your belly button may stick out.  You may notice more swelling in your hands,  face, or ankles.  You may have increased tingling or numbness in your hands, arms, and legs. The skin on your belly may also feel numb.  You may feel short of breath because of your expanding uterus.  You may have more problems sleeping. This can be caused by the size of your belly, increased need to urinate, and an increase in your body's metabolism.  You may notice the fetus "dropping," or moving lower in your abdomen (lightening).  You may have increased vaginal discharge.  You may notice your joints feel loose and you may have pain around your pelvic bone.  What to expect at prenatal visits You will have prenatal exams every 2 weeks until week 36. Then you will have weekly prenatal exams. During a routine prenatal visit:  You will be weighed to make sure you and the baby are growing normally.  Your blood pressure will be taken.  Your abdomen will be measured to track your baby's growth.  The fetal heartbeat will be listened to.  Any test results from the previous visit will be discussed.  You may have a cervical check near your due date to see if your cervix has softened or thinned (effaced).  You will be tested for Group B streptococcus. This happens between 35 and 37 weeks.  Your health care provider may ask you:  What your birth plan is.  How you are feeling.  If you are feeling the baby move.  If you have had   any abnormal symptoms, such as leaking fluid, bleeding, severe headaches, or abdominal cramping.  If you are using any tobacco products, including cigarettes, chewing tobacco, and electronic cigarettes.  If you have any questions.  Other tests or screenings that may be performed during your third trimester include:  Blood tests that check for low iron levels (anemia).  Fetal testing to check the health, activity level, and growth of the fetus. Testing is done if you have certain medical conditions or if there are problems during the  pregnancy.  Nonstress test (NST). This test checks the health of your baby to make sure there are no signs of problems, such as the baby not getting enough oxygen. During this test, a belt is placed around your belly. The baby is made to move, and its heart rate is monitored during movement.  What is false labor? False labor is a condition in which you feel small, irregular tightenings of the muscles in the womb (contractions) that usually go away with rest, changing position, or drinking water. These are called Braxton Hicks contractions. Contractions may last for hours, days, or even weeks before true labor sets in. If contractions come at regular intervals, become more frequent, increase in intensity, or become painful, you should see your health care provider. What are the signs of labor?  Abdominal cramps.  Regular contractions that start at 10 minutes apart and become stronger and more frequent with time.  Contractions that start on the top of the uterus and spread down to the lower abdomen and back.  Increased pelvic pressure and dull back pain.  A watery or bloody mucus discharge that comes from the vagina.  Leaking of amniotic fluid. This is also known as your "water breaking." It could be a slow trickle or a gush. Let your health care provider know if it has a color or strange odor. If you have any of these signs, call your health care provider right away, even if it is before your due date. Follow these instructions at home: Medicines  Follow your health care provider's instructions regarding medicine use. Specific medicines may be either safe or unsafe to take during pregnancy.  Take a prenatal vitamin that contains at least 600 micrograms (mcg) of folic acid.  If you develop constipation, try taking a stool softener if your health care provider approves. Eating and drinking  Eat a balanced diet that includes fresh fruits and vegetables, whole grains, good sources of protein  such as meat, eggs, or tofu, and low-fat dairy. Your health care provider will help you determine the amount of weight gain that is right for you.  Avoid raw meat and uncooked cheese. These carry germs that can cause birth defects in the baby.  If you have low calcium intake from food, talk to your health care provider about whether you should take a daily calcium supplement.  Eat four or five small meals rather than three large meals a day.  Limit foods that are high in fat and processed sugars, such as fried and sweet foods.  To prevent constipation: ? Drink enough fluid to keep your urine clear or pale yellow. ? Eat foods that are high in fiber, such as fresh fruits and vegetables, whole grains, and beans. Activity  Exercise only as directed by your health care provider. Most women can continue their usual exercise routine during pregnancy. Try to exercise for 30 minutes at least 5 days a week. Stop exercising if you experience uterine contractions.  Avoid heavy   lifting.  Do not exercise in extreme heat or humidity, or at high altitudes.  Wear low-heel, comfortable shoes.  Practice good posture.  You may continue to have sex unless your health care provider tells you otherwise. Relieving pain and discomfort  Take frequent breaks and rest with your legs elevated if you have leg cramps or low back pain.  Take warm sitz baths to soothe any pain or discomfort caused by hemorrhoids. Use hemorrhoid cream if your health care provider approves.  Wear a good support bra to prevent discomfort from breast tenderness.  If you develop varicose veins: ? Wear support pantyhose or compression stockings as told by your healthcare provider. ? Elevate your feet for 15 minutes, 3-4 times a day. Prenatal care  Write down your questions. Take them to your prenatal visits.  Keep all your prenatal visits as told by your health care provider. This is important. Safety  Wear your seat belt at  all times when driving.  Make a list of emergency phone numbers, including numbers for family, friends, the hospital, and police and fire departments. General instructions  Avoid cat litter boxes and soil used by cats. These carry germs that can cause birth defects in the baby. If you have a cat, ask someone to clean the litter box for you.  Do not travel far distances unless it is absolutely necessary and only with the approval of your health care provider.  Do not use hot tubs, steam rooms, or saunas.  Do not drink alcohol.  Do not use any products that contain nicotine or tobacco, such as cigarettes and e-cigarettes. If you need help quitting, ask your health care provider.  Do not use any medicinal herbs or unprescribed drugs. These chemicals affect the formation and growth of the baby.  Do not douche or use tampons or scented sanitary pads.  Do not cross your legs for long periods of time.  To prepare for the arrival of your baby: ? Take prenatal classes to understand, practice, and ask questions about labor and delivery. ? Make a trial run to the hospital. ? Visit the hospital and tour the maternity area. ? Arrange for maternity or paternity leave through employers. ? Arrange for family and friends to take care of pets while you are in the hospital. ? Purchase a rear-facing car seat and make sure you know how to install it in your car. ? Pack your hospital bag. ? Prepare the baby's nursery. Make sure to remove all pillows and stuffed animals from the baby's crib to prevent suffocation.  Visit your dentist if you have not gone during your pregnancy. Use a soft toothbrush to brush your teeth and be gentle when you floss. Contact a health care provider if:  You are unsure if you are in labor or if your water has broken.  You become dizzy.  You have mild pelvic cramps, pelvic pressure, or nagging pain in your abdominal area.  You have lower back pain.  You have persistent  nausea, vomiting, or diarrhea.  You have an unusual or bad smelling vaginal discharge.  You have pain when you urinate. Get help right away if:  Your water breaks before 37 weeks.  You have regular contractions less than 5 minutes apart before 37 weeks.  You have a fever.  You are leaking fluid from your vagina.  You have spotting or bleeding from your vagina.  You have severe abdominal pain or cramping.  You have rapid weight loss or weight gain.    You have shortness of breath with chest pain.  You notice sudden or extreme swelling of your face, hands, ankles, feet, or legs.  Your baby makes fewer than 10 movements in 2 hours.  You have severe headaches that do not go away when you take medicine.  You have vision changes. Summary  The third trimester is from week 28 through week 40, months 7 through 9. The third trimester is a time when the unborn baby (fetus) is growing rapidly.  During the third trimester, your discomfort may increase as you and your baby continue to gain weight. You may have abdominal, leg, and back pain, sleeping problems, and an increased need to urinate.  During the third trimester your breasts will keep growing and they will continue to become tender. A yellow fluid (colostrum) may leak from your breasts. This is the first milk you are producing for your baby.  False labor is a condition in which you feel small, irregular tightenings of the muscles in the womb (contractions) that eventually go away. These are called Braxton Hicks contractions. Contractions may last for hours, days, or even weeks before true labor sets in.  Signs of labor can include: abdominal cramps; regular contractions that start at 10 minutes apart and become stronger and more frequent with time; watery or bloody mucus discharge that comes from the vagina; increased pelvic pressure and dull back pain; and leaking of amniotic fluid. This information is not intended to replace advice  given to you by your health care provider. Make sure you discuss any questions you have with your health care provider. Document Released: 01/09/2001 Document Revised: 06/23/2015 Document Reviewed: 03/18/2012 Elsevier Interactive Patient Education  2017 [employer]  

## 2017-03-14 ENCOUNTER — Ambulatory Visit (INDEPENDENT_AMBULATORY_CARE_PROVIDER_SITE_OTHER): Payer: Self-pay | Admitting: Internal Medicine

## 2017-03-14 ENCOUNTER — Other Ambulatory Visit: Payer: Self-pay

## 2017-03-14 ENCOUNTER — Encounter: Payer: Self-pay | Admitting: Internal Medicine

## 2017-03-14 ENCOUNTER — Inpatient Hospital Stay (HOSPITAL_COMMUNITY)
Admission: AD | Admit: 2017-03-14 | Discharge: 2017-03-14 | Disposition: A | Discharge status: Home / Self Care | Payer: Self-pay | Source: Ambulatory Visit | Attending: Obstetrics & Gynecology | Admitting: Obstetrics & Gynecology

## 2017-03-14 DIAGNOSIS — O26893 Other specified pregnancy related conditions, third trimester: Secondary | ICD-10-CM | POA: Insufficient documentation

## 2017-03-14 DIAGNOSIS — O9989 Other specified diseases and conditions complicating pregnancy, childbirth and the puerperium: Secondary | ICD-10-CM

## 2017-03-14 DIAGNOSIS — M545 Low back pain: Secondary | ICD-10-CM | POA: Insufficient documentation

## 2017-03-14 DIAGNOSIS — Z3493 Encounter for supervision of normal pregnancy, unspecified, third trimester: Secondary | ICD-10-CM

## 2017-03-14 DIAGNOSIS — O99891 Other specified diseases and conditions complicating pregnancy: Secondary | ICD-10-CM

## 2017-03-14 DIAGNOSIS — Z3A36 36 weeks gestation of pregnancy: Secondary | ICD-10-CM | POA: Insufficient documentation

## 2017-03-14 DIAGNOSIS — M549 Dorsalgia, unspecified: Secondary | ICD-10-CM

## 2017-03-14 LAB — URINALYSIS, ROUTINE W REFLEX MICROSCOPIC
BILIRUBIN URINE: NEGATIVE
Glucose, UA: NEGATIVE mg/dL
Hgb urine dipstick: NEGATIVE
KETONES UR: 5 mg/dL — AB
Leukocytes, UA: NEGATIVE
Nitrite: NEGATIVE
PROTEIN: NEGATIVE mg/dL
Specific Gravity, Urine: 1.02 (ref 1.005–1.030)
pH: 6 (ref 5.0–8.0)

## 2017-03-14 LAB — OB RESULTS CONSOLE GBS: STREP GROUP B AG: NEGATIVE

## 2017-03-14 MED ORDER — CYCLOBENZAPRINE HCL 5 MG PO TABS
5.0000 mg | ORAL_TABLET | Freq: Once | ORAL | Status: AC
  Administered 2017-03-14: 5 mg via ORAL
  Filled 2017-03-14: qty 1

## 2017-03-14 MED ORDER — ONDANSETRON 4 MG PO TBDP
4.0000 mg | ORAL_TABLET | Freq: Once | ORAL | Status: AC
  Administered 2017-03-14: 4 mg via ORAL
  Filled 2017-03-14: qty 1

## 2017-03-14 MED ORDER — CYCLOBENZAPRINE HCL 5 MG PO TABS: 5.0000 mg | ORAL_TABLET | Freq: Four times a day (QID) | ORAL | 0 refills | Status: DC | PRN

## 2017-03-14 NOTE — MAU Note (Signed)
Pt reporting constant abdominal pain since noon, radiates to back, states it's "sore." Does not feel like contractions; rating 10/10

## 2017-03-14 NOTE — MAU Note (Signed)
PT SAYS  [facility]   WITH [facility] -  THERE THIS  AM - VE - CLOSED.   DENIES HSV AND MRSA  .  UC'S HURT   SINCE 1PM

## 2017-03-14 NOTE — Patient Instructions (Signed)

## 2017-03-14 NOTE — Discharge Instructions (Signed)
Tercer trimestre de Psychiatrist (Third Trimester of Pregnancy) El tercer trimestre comprende desde la semana29 hasta la semana42, es decir, desde el mes7 hasta el [address] En este trimestre, el feto crece muy rpido. Hacia el final del noveno mes, el feto mide alrededor de 20pulgadas (45cm) de largo y pesa entre 6y 10libras [phone]). CUIDADOS EN EL HOGAR  No fume, no consuma hierbas ni beba alcohol. No tome frmacos que el mdico no haya autorizado.  No consuma ningn producto que contenga tabaco, lo que incluye cigarrillos, tabaco de Theatre manager o Administrator, Civil Service. Si necesita ayuda para dejar de fumar, consulte al [employer]. Puede recibir asesoramiento u otro tipo de apoyo para dejar de fumar.  Tome los medicamentos solamente como se lo haya indicado el mdico. Algunos medicamentos son seguros para tomar durante el Psychiatrist y otros no lo son.  Haga ejercicios solamente como se lo haya indicado el mdico. Interrumpa la actividad fsica si comienza a tener calambres.  Ingiera alimentos saludables de [place] regular.  Use un sostn que le brinde buen soporte si sus mamas estn sensibles.  No se d baos de inmersin en agua caliente, baos turcos ni saunas.  Colquese el cinturn de seguridad cuando conduzca.  No coma carne cruda ni queso sin cocinar; evite el contacto con las bandejas sanitarias de los gatos y la tierra que estos animales usan.  Tome las vitaminas prenatales.  Tome entre 1500 y 2000mg  de calcio diariamente comenzando en la semana20 del embarazo [place].  Pruebe tomar un medicamento que la ayude a defecar (un laxante suave) si el mdico lo autoriza. Consuma ms fibra, que se encuentra en las frutas y verduras frescas y los cereales integrales. Beba suficiente lquido para mantener el pis (orina) claro o de color amarillo plido.  Dese baos de asiento con agua tibia para Engineer, materials o las molestias causadas por las hemorroides. Use una crema para  las hemorroides si el mdico la autoriza.  Si se le hinchan las venas (venas varicosas), use medias de descanso. Levante (eleve) los pies durante , 3 o 4veces por Futures trader. Limite el consumo de sal en su dieta.  No levante objetos pesados, use zapatos de tacones bajos y sintese derecha.  Descanse con las piernas elevadas si tiene calambres o dolor de cintura.  Visite a su dentista si no lo ha Occupational hygienist. Use un cepillo de cerdas suaves para cepillarse los dientes. Psese el hilo dental con suavidad.  Puede seguir [employer], a menos que el mdico le indique lo contrario.  No haga viajes de larga distancia, excepto si es obligatorio y solamente con la aprobacin del mdico.  Tome clases prenatales.  Practique ir manejando al hospital.  Prepare el bolso que llevar al hospital.  Prepare la habitacin del beb.  Concurra a los controles mdicos.  SOLICITE AYUDA SI:  No est segura de si est en trabajo de parto o si ha roto la bolsa de las aguas.  Tiene mareos.  Siente calambres leves o presin en la parte inferior del abdomen.  Sufre un dolor persistente en el abdomen.  Tiene Programme researcher, broadcasting/film/video (nuseas), vmitos, o tiene deposiciones acuosas (diarrea).  Advierte un olor ftido que proviene de la vagina.  Siente dolor al [employer]ips.  SOLICITE AYUDA DE INMEDIATO SI:  Tiene fiebre.  Tiene una prdida de lquido por la vagina.  Tiene sangrado o pequeas prdidas vaginales.  Siente dolor intenso o clicos en el abdomen.  Sube o baja de peso rpidamente.  Tiene dificultades para recuperar el aliento y siente dolor en el pecho.  Sbitamente se le hinchan mucho el rostro, las Edmonstonmanos, los tobillos, los pies o las piernas.  No ha sentido los movimientos del beb durante Georgianne Fickuna hora.  Siente un dolor de cabeza intenso que no se alivia con medicamentos.  Su visin se modifica.  Esta informacin no tiene Theme park managercomo fin reemplazar el consejo  del mdico. Asegrese de hacerle al mdico cualquier pregunta que tenga. Document Released: 09/17/2012 Document Revised: 02/05/2014 Document Reviewed: 03/18/2012 Elsevier Interactive Patient Education  2017 Elsevier Inc. Dolor de espalda en adultos (Back Pain, Adult) El dolor de espalda es muy frecuente. A menudo mejora con el tiempo. La causa del dolor de espalda generalmente no es peligrosa. La Harley-Davidsonmayora de las personas puede aprender a Runner, broadcasting/film/videomanejar el dolor de espalda por s mismas. CUIDADOS EN EL HOGAR Controle su dolor de espalda a fin de Public house managerdetectar algn cambio. Las siguientes indicaciones ayudarn a Psychologist, clinicalaliviar cualquier dolor que pueda sentir:  Materials engineerMantngase activo. Comience con caminatas cortas sobre superficies planas si es posible. Trate de caminar un poco ms cada da.  Haga ejercicios con regularidad tal como le indic el mdico. El ejercicio ayuda a que su espalda se cure ms rpidamente. Tambin ayuda a prevenir futuras lesiones al Kimberly-Clarkmantener los msculos fuertes y flexibles.  No se siente, conduzca ni permanezca de pie durante ms de 30 minutos.  No permanezca en la cama. Si hace reposo ms de 1 a 2 das, puede demorar su recuperacin.  Sea cuidadoso al inclinarse o levantar un objeto. Use una tcnica apropiada para levantar peso: ? Flexione las rodillas. ? Mantenga el objeto cerca del cuerpo. ? No gire.  Duerma sobre un NVR Inccolchn firme. Recustese sobre un costado y flexione las rodillas. Si se recuesta Fisher Scientificsobre la espalda, coloque una almohada debajo de las rodillas.  Tome los medicamentos solamente como se lo haya indicado el mdico.  Aplique hielo sobre la zona lesionada. ? Ponga el hielo en una bolsa plstica. ? Coloque una FirstEnergy Corptoalla entre la piel y la bolsa de hielo. ? Deje el hielo durante 20minutos, 2 a 3veces por da, durante los primeros 2 o 3das. Despus de eso, puede alternar entre compresas de hielo y Airline pilotcalor.  Evite sentir ansiedad o estrs. Encuentre maneras efectivas de lidiar  con el estrs, Surveyor, miningcomo hacer ejercicio.  Mantenga un peso saludable. El peso excesivo ejerce tensin sobre la espalda. SOLICITE AYUDA SI:  Siente dolor que no se alivia con reposo o medicamentos.  Siente cada vez ms dolor que se extiende a las piernas o los glteos.  El dolor no mejora en una semana.  Siente dolor por la noche.  Pierde peso.  Siente escalofros o fiebre. SOLICITE AYUDA DE INMEDIATO SI:  No puede controlar su materia fecal (heces) o el pis (orina).  Siente debilidad en las piernas o los brazos.  Siente prdida de la sensibilidad (adormecimiento) en las piernas o los brazos.  Tiene malestar estomacal (nuseas) o vomita.  Siente dolor de estmago (abdominal).  Siente que se desvanece (se desmaya). Esta informacin no tiene Theme park managercomo fin reemplazar el consejo del mdico. Asegrese de hacerle al mdico cualquier pregunta que tenga. Document Released: 07/31/2010 Document Revised: 02/05/2014 Document Reviewed: 05/19/2013 Elsevier Interactive Patient Education  [employer]2018 Elsevier Inc.

## 2017-03-14 NOTE — Progress Notes (Signed)
Amber Murphy is a 27 y.o. G3P1011 at 1876w1d for routine follow up. She has a history of pyelonephritis during the first trimester.  She reports of no concerns. She denies contractions. She has some pelvic pain maybe every 2 weeks. This usually .  See flow sheet for details.  HR 127 FH 35cm  Runny Nose: - rhinorrhea x 2 days  - coughing mucous - no difficulty breathing   A/P: Pregnancy at 5076w1d.  Doing well.   Pregnancy issues include: none  Infant feeding choice: breastfeeding  Contraception choice: POP  Infant circumcision desired not applicable  Patient received Rx for Flu and Tdap to take to health department. She was told to go the pharmacy. Reports she will be going to pharmacy today  GBS/GC/CZ testing was performed today.  Preterm labor precautions reviewed. Safe sleep discussed. Kick counts reviewed. Follow up 1 week.

## 2017-03-14 NOTE — MAU Provider Note (Signed)
Chief Complaint:  Labor Eval   First Provider Initiated Contact with Patient 03/14/17 2122     HPI: Amber Murphy is a 27 y.o. G3P1011 at 10136w1dwho presents to maternity admissions reporting lower abdominal pain and low back pain . Worried it is pyelonephritis like before.  No dysuria or fever however.  Does admit to lots of housework and walking today "for a long time"  Back pain is worse than abdominal pain.   Interpretor Orlan LeavensViria Alvarez used..  She reports good fetal movement, denies LOF, vaginal bleeding, vaginal itching/burning, urinary symptoms, h/a, dizziness, n/v, diarrhea, constipation or fever/chills.    Back Pain  This is a new problem. The current episode started today. The problem occurs constantly. The problem is unchanged. The pain is present in the lumbar spine. The quality of the pain is described as aching. The pain does not radiate. The symptoms are aggravated by bending, twisting and position. Stiffness is present all day. Associated symptoms include abdominal pain. Pertinent negatives include no dysuria, fever, headaches, leg pain, numbness, paresthesias or weakness. She has tried nothing for the symptoms.    RN Note: Pt reporting constant abdominal pain since noon, radiates to back, states it's "sore." Does not feel like contractions; rating 10/10   Past Medical History: Past Medical History:  Diagnosis Date  . Medical history non-contributory   . No pertinent past medical history     Past obstetric history: OB History  Gravida Para Term Preterm AB Living  3 1 1  0 1 1  SAB TAB Ectopic Multiple Live Births  0 0 0 0 1    # Outcome Date GA Lbr Len/2nd Weight Sex Delivery Anes PTL Lv  3 Current           2 AB 2016          1 Term 03/10/11 6012w5d 05:23 / 01:16 8 lb 0.9 oz (3.654 kg) F Vag-Spont Local  LIV     Birth Comments: WNL      Past Surgical History: Past Surgical History:  Procedure Laterality Date  . NO PAST SURGERIES      Family  History: Family History  Problem Relation Age of Onset  . Early death Father     Social History: Social History   Tobacco Use  . Smoking status: Never Smoker  . Smokeless tobacco: Never Used  Substance Use Topics  . Alcohol use: No  . Drug use: No    Allergies: No Known Allergies  Meds:  Medications Prior to Admission  Medication Sig Dispense Refill Last Dose  . clotrimazole (CLOTRIMAZOLE-7) 1 % vaginal cream Place 1 Applicatorful vaginally at bedtime. For 7 days 45 g 0   . cyclobenzaprine (FLEXERIL) 5 MG tablet Take 2 tablets (10 mg total) by mouth 3 (three) times daily as needed (muscle spasm or pain). 20 tablet 0   . docusate sodium (COLACE) 100 MG capsule Take 1 capsule (100 mg total) by mouth daily. 10 capsule 0 Unknown at Unknown time  . oxyCODONE-acetaminophen (PERCOCET/ROXICET) 5-325 MG tablet Take 1-2 tablets by mouth every 6 (six) hours as needed. 10 tablet 0 Unknown at Unknown time  . Prenatal MV-Min-Fe Fum-FA-DHA ([facility] WOMENS PRENATAL+DHA) 28-0.975 & 200 MG MISC Take 1 tablet by mouth daily. 60 each 0 01/15/2017 at Unknown time    I have reviewed patient's Past Medical Hx, Surgical Hx, Family Hx, Social Hx, medications and allergies.   ROS:  Review of Systems  Constitutional: Negative for fever.  Gastrointestinal: Positive for abdominal pain.  Genitourinary: Negative for dysuria.  Musculoskeletal: Positive for back pain.  Neurological: Negative for weakness, numbness, headaches and paresthesias.   Other systems negative  Physical Exam   Patient Vitals for the past 24 hrs:  Height Weight  03/14/17 2044 5\' 1"  (1.549 m) 146 lb 12 oz (66.6 kg)   Constitutional: Well-developed, well-nourished female in no acute distress.  Cardiovascular: normal rate and rhythm Respiratory: normal effort, clear to auscultation bilaterally GI: Abd soft, non-tender, gravid appropriate for gestational age.   No rebound or guarding. MS: Extremities nontender, no edema, normal  ROM Neurologic: Alert and oriented x 4.  GU: Neg CVAT.  PELVIC EXAM: Dilation: Closed Effacement (%): Thick, 60 Cervical Position: Posterior Exam by:: Roxan Hockey     FHT:  Baseline 140 , moderate variability, accelerations present, no decelerations Contractions:  Irregular, infrequent   Labs: Results for orders placed or performed during the hospital encounter of 03/14/17 (from the past 24 hour(s))  Urinalysis, Routine w reflex microscopic     Status: Abnormal   Collection Time: 03/14/17  8:56 PM  Result Value Ref Range   Color, Urine YELLOW YELLOW   APPearance HAZY (A) CLEAR   Specific Gravity, Urine 1.020 1.005 - 1.030   pH 6.0 5.0 - 8.0   Glucose, UA NEGATIVE NEGATIVE mg/dL   Hgb urine dipstick NEGATIVE NEGATIVE   Bilirubin Urine NEGATIVE NEGATIVE   Ketones, ur 5 (A) NEGATIVE mg/dL   Protein, ur NEGATIVE NEGATIVE mg/dL   Nitrite NEGATIVE NEGATIVE   Leukocytes, UA NEGATIVE NEGATIVE   O/Positive/-- (10/16 1023)  Imaging:  No results found.  MAU Course/MDM: I have ordered labs and reviewed results. No sign of pyelonephritis NST reviewed, reactive.  Treatments in MAU included Flexeril which did afford moderate relief..  Never filled Flexeril Rx from prior back pain tx.  Assessment: SIUP at [redacted]w[redacted]d Low back pain, likely spasm Intermittent mild contractions, not in labor  Plan: Discharge home Rx Flexeril for PRN use Heat to back Labor precautions and fetal kick counts Follow up in Office for prenatal visits and recheck  Encouraged to return here or to other Urgent Care/ED if she develops worsening of symptoms, increase in pain, fever, or other concerning symptoms.   Pt stable at time of discharge.  Wynelle Bourgeois CNM, MSN Certified Nurse-Midwife 03/14/2017 9:22 PM

## 2017-03-15 LAB — CERVICOVAGINAL ANCILLARY ONLY
Chlamydia: NEGATIVE
Neisseria Gonorrhea: NEGATIVE

## 2017-03-18 ENCOUNTER — Encounter: Payer: Self-pay | Admitting: Internal Medicine

## 2017-03-18 LAB — CULTURE, BETA STREP (GROUP B ONLY): STREP GP B CULTURE: NEGATIVE

## 2017-03-18 NOTE — Progress Notes (Signed)
Sent letter with negative results

## 2017-03-22 ENCOUNTER — Ambulatory Visit (INDEPENDENT_AMBULATORY_CARE_PROVIDER_SITE_OTHER): Payer: Self-pay | Admitting: Family Medicine

## 2017-03-22 VITALS — BP 92/50 | HR 76 | Temp 98.0°F | Wt 149.6 lb

## 2017-03-22 DIAGNOSIS — Z3493 Encounter for supervision of normal pregnancy, unspecified, third trimester: Secondary | ICD-10-CM

## 2017-03-22 NOTE — Patient Instructions (Addendum)
Come to the MAU (maternity admission unit) for 1) Strong contractions every 2-3 minutes for at least 1 hour that do not go away when you drink water or take a warm shower. These contractions will be so strong all you can do is breath through them 2) Vaginal bleeding- anything more than spotting 3) Loss of fluid like you broke your water 4) Decreased movement of your baby   Please go to your Ultrasound appointment on 03/28/17 at 10:00 AM at the health department, we will follow up with you in 1 week.    Sincerely,  Anders Simmondshristina Ameisha Mcclellan

## 2017-03-22 NOTE — Progress Notes (Addendum)
Amber Murphy is a 27 y.o. G3P1011 at 1755w2d for routine follow up.  Amber Murphy reports some groin pain on the left that occurs intermittently in the morning. No vaginal discharge, vaginal bleeding, contractions.  See flow sheet for details.  A/P: Pregnancy at 2355w2d.  Doing well. Pregnancy issues include: Amber Murphy has a history of pyelonephritis during the first trimester, was treated   Size/date discrepancy today: FH barely 35 cm. Abdomen looks small. OB Ultrasound ordered and scheduled at the health department on 03/28/17 at 10:00AM  Infant feeding choice: BF Contraception choice: POP Infant circumcision desired not applicable GBS/GC/CZ results were reviewed today.   Labor precautions reviewed. Kick counts reviewed. Follow up in 1 week  Anders Simmondshristina Derk Doubek, MD [facility], PGY-3

## 2017-03-28 ENCOUNTER — Encounter: Payer: Self-pay | Admitting: Family Medicine

## 2017-04-03 ENCOUNTER — Other Ambulatory Visit: Payer: Self-pay

## 2017-04-03 ENCOUNTER — Ambulatory Visit (INDEPENDENT_AMBULATORY_CARE_PROVIDER_SITE_OTHER): Payer: Self-pay | Admitting: Family Medicine

## 2017-04-03 ENCOUNTER — Encounter: Payer: Self-pay | Admitting: Family Medicine

## 2017-04-03 VITALS — BP 90/52 | HR 101 | Temp 98.2°F | Wt 151.0 lb

## 2017-04-03 DIAGNOSIS — O26843 Uterine size-date discrepancy, third trimester: Secondary | ICD-10-CM

## 2017-04-03 DIAGNOSIS — Z3493 Encounter for supervision of normal pregnancy, unspecified, third trimester: Secondary | ICD-10-CM

## 2017-04-03 NOTE — Progress Notes (Signed)
Amber DikeKristell Murphy is a 27 y.o. G3P1011 at 5969w0d for routine follow up.  She reports no vaginal discharge, vaginal bleeding, contractions. See flow sheet for details.  A/P: Pregnancy at 6569w0d.  Doing well.    Pregnancy issues include She has a history of pyelonephritis during the first trimester, was treated.   Size date descrepency on last OB visit at 37 weeks, OB U/S ordered, she did not go to the HD to obtain this. Has persistent size/date discrepancy today: FH barely 35 cm. Abdomen looks small. [facility] at 3:30PM  Infant feeding choice: BF Contraception choice: POP Infant circumcision desired not applicable GBS/GC/CZ results were reviewed today.   Labor precautions reviewed. Kick counts reviewed. Follow up in 1 week  Anders Simmondshristina Gambino, MD [facility], PGY-3

## 2017-04-03 NOTE — Patient Instructions (Addendum)
Come to the MAU (maternity admission unit) for 1) Strong contractions every 2-3 minutes for at least 1 hour that do not go away when you drink water or take a warm shower. These contractions will be so strong all you can do is breath through them 2) Vaginal bleeding- anything more than spotting 3) Loss of fluid like you broke your water 4) Decreased movement of your baby  PLEASE GO TO YOUR ULTRASOUND   Anders Simmondshristina Remedy Corporan, MD [facility]

## 2017-04-04 ENCOUNTER — Encounter (HOSPITAL_COMMUNITY): Payer: Self-pay

## 2017-04-04 ENCOUNTER — Other Ambulatory Visit: Payer: Self-pay | Admitting: Family Medicine

## 2017-04-04 ENCOUNTER — Ambulatory Visit (HOSPITAL_COMMUNITY)
Admission: RE | Admit: 2017-04-04 | Discharge: 2017-04-04 | Disposition: A | Discharge status: Home / Self Care | Payer: Self-pay | Source: Ambulatory Visit | Attending: Family Medicine | Admitting: Family Medicine

## 2017-04-04 DIAGNOSIS — Z3A39 39 weeks gestation of pregnancy: Secondary | ICD-10-CM

## 2017-04-04 DIAGNOSIS — Z3493 Encounter for supervision of normal pregnancy, unspecified, third trimester: Secondary | ICD-10-CM

## 2017-04-04 DIAGNOSIS — Z3A38 38 weeks gestation of pregnancy: Secondary | ICD-10-CM | POA: Insufficient documentation

## 2017-04-04 DIAGNOSIS — Z3689 Encounter for other specified antenatal screening: Secondary | ICD-10-CM

## 2017-04-04 DIAGNOSIS — O26843 Uterine size-date discrepancy, third trimester: Secondary | ICD-10-CM | POA: Insufficient documentation

## 2017-04-05 ENCOUNTER — Other Ambulatory Visit (HOSPITAL_COMMUNITY): Payer: Self-pay | Admitting: *Deleted

## 2017-04-05 ENCOUNTER — Encounter (HOSPITAL_COMMUNITY): Payer: Self-pay

## 2017-04-05 ENCOUNTER — Inpatient Hospital Stay (HOSPITAL_COMMUNITY)
Admission: AD | Admit: 2017-04-05 | Discharge: 2017-04-05 | Disposition: A | Discharge status: Home / Self Care | Payer: Self-pay | Source: Ambulatory Visit | Attending: Obstetrics and Gynecology | Admitting: Obstetrics and Gynecology

## 2017-04-05 DIAGNOSIS — Z3483 Encounter for supervision of other normal pregnancy, third trimester: Secondary | ICD-10-CM | POA: Insufficient documentation

## 2017-04-05 DIAGNOSIS — O479 False labor, unspecified: Secondary | ICD-10-CM

## 2017-04-05 DIAGNOSIS — Z3A39 39 weeks gestation of pregnancy: Secondary | ICD-10-CM | POA: Insufficient documentation

## 2017-04-05 DIAGNOSIS — O4100X0 Oligohydramnios, unspecified trimester, not applicable or unspecified: Secondary | ICD-10-CM

## 2017-04-05 NOTE — Discharge Instructions (Signed)

## 2017-04-05 NOTE — MAU Note (Signed)
I have communicated with Dr. Linwood Dibblesumball and reviewed vital signs:  Vitals:   04/05/17 1111 04/05/17 1254  BP: 115/71 94/70  Pulse: (!) 107   Resp: 18   Temp: (!) 97.5 F (36.4 C)     Vaginal exam:  Dilation: 3 Effacement (%): 70 Cervical Position: Posterior Station: -3 Presentation: Vertex Exam by:: GrenadaBrittany Jatziry Wechter, RN,   Also reviewed contraction pattern and that non-stress test is reactive.  It has been documented that patient is contracting every 3-5 minutes with minimal cervical change over 2 hours not indicating active labor.  Patient denies any other complaints.  Based on this report provider has given order for discharge.  Strict labor precautions given to patient. A discharge order and diagnosis entered by a provider.   Labor discharge instructions reviewed with patient.

## 2017-04-05 NOTE — MAU Note (Signed)
Pt presents to MAU with ctx that started around 8 am. Pt denies VB and LOF. +FM

## 2017-04-06 ENCOUNTER — Inpatient Hospital Stay (HOSPITAL_COMMUNITY)
Admission: AD | Admit: 2017-04-06 | Discharge: 2017-04-08 | DRG: 807 | Disposition: A | Discharge status: Home / Self Care | Payer: Medicaid Other | Source: Ambulatory Visit | Attending: Family Medicine | Admitting: Family Medicine

## 2017-04-06 ENCOUNTER — Inpatient Hospital Stay (HOSPITAL_COMMUNITY): Payer: Medicaid Other | Admitting: Anesthesiology

## 2017-04-06 ENCOUNTER — Encounter (HOSPITAL_COMMUNITY): Payer: Self-pay | Admitting: *Deleted

## 2017-04-06 DIAGNOSIS — Z3483 Encounter for supervision of other normal pregnancy, third trimester: Secondary | ICD-10-CM | POA: Diagnosis present

## 2017-04-06 DIAGNOSIS — Z3A39 39 weeks gestation of pregnancy: Secondary | ICD-10-CM

## 2017-04-06 LAB — CBC
HCT: 38.4 % (ref 36.0–46.0)
Hemoglobin: 13.5 g/dL (ref 12.0–15.0)
MCH: 30.1 pg (ref 26.0–34.0)
MCHC: 35.2 g/dL (ref 30.0–36.0)
MCV: 85.7 fL (ref 78.0–100.0)
PLATELETS: 230 10*3/uL (ref 150–400)
RBC: 4.48 MIL/uL (ref 3.87–5.11)
RDW: 13.5 % (ref 11.5–15.5)
WBC: 9 10*3/uL (ref 4.0–10.5)

## 2017-04-06 LAB — TYPE AND SCREEN
ABO/RH(D): O POS
ANTIBODY SCREEN: NEGATIVE

## 2017-04-06 LAB — RPR: RPR: NONREACTIVE

## 2017-04-06 MED ORDER — OXYTOCIN BOLUS FROM INFUSION
500.0000 mL | Freq: Once | INTRAVENOUS | Status: AC
  Administered 2017-04-06: 500 mL via INTRAVENOUS

## 2017-04-06 MED ORDER — EPHEDRINE 5 MG/ML INJ: 10.0000 mg | INTRAVENOUS | Status: DC | PRN

## 2017-04-06 MED ORDER — PHENYLEPHRINE 40 MCG/ML (10ML) SYRINGE FOR IV PUSH (FOR BLOOD PRESSURE SUPPORT)
80.0000 ug | PREFILLED_SYRINGE | INTRAVENOUS | Status: DC | PRN
  Filled 2017-04-06: qty 10
  Filled 2017-04-06: qty 5

## 2017-04-06 MED ORDER — SOD CITRATE-CITRIC ACID 500-334 MG/5ML PO SOLN: 30.0000 mL | ORAL | Status: DC | PRN

## 2017-04-06 MED ORDER — ACETAMINOPHEN 325 MG PO TABS: 650.0000 mg | ORAL_TABLET | ORAL | Status: DC | PRN

## 2017-04-06 MED ORDER — EPHEDRINE 5 MG/ML INJ
10.0000 mg | INTRAVENOUS | Status: DC | PRN
  Filled 2017-04-06: qty 2

## 2017-04-06 MED ORDER — TERBUTALINE SULFATE 1 MG/ML IJ SOLN
0.2500 mg | Freq: Once | INTRAMUSCULAR | Status: DC | PRN
  Filled 2017-04-06: qty 1

## 2017-04-06 MED ORDER — SENNOSIDES-DOCUSATE SODIUM 8.6-50 MG PO TABS
2.0000 | ORAL_TABLET | ORAL | Status: DC
  Administered 2017-04-06 – 2017-04-07 (×2): 2 via ORAL
  Filled 2017-04-06 (×2): qty 2

## 2017-04-06 MED ORDER — OXYCODONE-ACETAMINOPHEN 5-325 MG PO TABS: 1.0000 | ORAL_TABLET | ORAL | Status: DC | PRN

## 2017-04-06 MED ORDER — OXYCODONE-ACETAMINOPHEN 5-325 MG PO TABS: 2.0000 | ORAL_TABLET | ORAL | Status: DC | PRN

## 2017-04-06 MED ORDER — COCONUT OIL OIL: 1.0000 "application " | TOPICAL_OIL | Status: DC | PRN

## 2017-04-06 MED ORDER — TETANUS-DIPHTH-ACELL PERTUSSIS 5-2.5-18.5 LF-MCG/0.5 IM SUSP: 0.5000 mL | Freq: Once | INTRAMUSCULAR | Status: DC

## 2017-04-06 MED ORDER — MAGNESIUM HYDROXIDE 400 MG/5ML PO SUSP: 30.0000 mL | ORAL | Status: DC | PRN

## 2017-04-06 MED ORDER — PHENYLEPHRINE 40 MCG/ML (10ML) SYRINGE FOR IV PUSH (FOR BLOOD PRESSURE SUPPORT)
80.0000 ug | PREFILLED_SYRINGE | INTRAVENOUS | Status: DC | PRN
  Filled 2017-04-06: qty 5

## 2017-04-06 MED ORDER — BENZOCAINE-MENTHOL 20-0.5 % EX AERO: 1.0000 "application " | INHALATION_SPRAY | CUTANEOUS | Status: DC | PRN

## 2017-04-06 MED ORDER — FLEET ENEMA 7-19 GM/118ML RE ENEM: 1.0000 | ENEMA | RECTAL | Status: DC | PRN

## 2017-04-06 MED ORDER — PRENATAL MULTIVITAMIN CH
1.0000 | ORAL_TABLET | Freq: Every day | ORAL | Status: DC
  Administered 2017-04-07 – 2017-04-08 (×2): 1 via ORAL
  Filled 2017-04-06 (×2): qty 1

## 2017-04-06 MED ORDER — WITCH HAZEL-GLYCERIN EX PADS: 1.0000 "application " | MEDICATED_PAD | CUTANEOUS | Status: DC | PRN

## 2017-04-06 MED ORDER — FENTANYL CITRATE (PF) 100 MCG/2ML IJ SOLN
100.0000 ug | INTRAMUSCULAR | Status: DC | PRN
  Administered 2017-04-06: 100 ug via INTRAVENOUS
  Filled 2017-04-06: qty 2

## 2017-04-06 MED ORDER — OXYTOCIN 40 UNITS IN LACTATED RINGERS INFUSION - SIMPLE MED
2.5000 [IU]/h | INTRAVENOUS | Status: DC
  Administered 2017-04-06: 2.5 [IU]/h via INTRAVENOUS

## 2017-04-06 MED ORDER — LACTATED RINGERS IV SOLN: 500.0000 mL | INTRAVENOUS | Status: DC | PRN

## 2017-04-06 MED ORDER — FENTANYL 2.5 MCG/ML BUPIVACAINE 1/10 % EPIDURAL INFUSION (WH - ANES)
14.0000 mL/h | INTRAMUSCULAR | Status: DC | PRN
  Administered 2017-04-06: 12 mL/h via EPIDURAL
  Administered 2017-04-06: 14 mL/h via EPIDURAL
  Filled 2017-04-06 (×2): qty 100

## 2017-04-06 MED ORDER — LACTATED RINGERS IV SOLN
INTRAVENOUS | Status: DC
  Administered 2017-04-06 (×4): via INTRAVENOUS

## 2017-04-06 MED ORDER — LIDOCAINE HCL (PF) 1 % IJ SOLN
30.0000 mL | INTRAMUSCULAR | Status: DC | PRN
  Administered 2017-04-06: 30 mL via SUBCUTANEOUS
  Filled 2017-04-06: qty 30

## 2017-04-06 MED ORDER — LACTATED RINGERS IV SOLN
500.0000 mL | Freq: Once | INTRAVENOUS | Status: AC
  Administered 2017-04-06: 500 mL via INTRAVENOUS

## 2017-04-06 MED ORDER — LIDOCAINE HCL (PF) 1 % IJ SOLN
INTRAMUSCULAR | Status: DC | PRN
  Administered 2017-04-06 (×2): 5 mL via EPIDURAL

## 2017-04-06 MED ORDER — ONDANSETRON HCL 4 MG PO TABS
4.0000 mg | ORAL_TABLET | ORAL | Status: DC | PRN
Start: 2017-04-06 — End: 2017-04-08

## 2017-04-06 MED ORDER — SIMETHICONE 80 MG PO CHEW
80.0000 mg | CHEWABLE_TABLET | ORAL | Status: DC | PRN
Start: 2017-04-06 — End: 2017-04-08

## 2017-04-06 MED ORDER — ONDANSETRON HCL 4 MG/2ML IJ SOLN: 4.0000 mg | Freq: Four times a day (QID) | INTRAMUSCULAR | Status: DC | PRN

## 2017-04-06 MED ORDER — IBUPROFEN 600 MG PO TABS
600.0000 mg | ORAL_TABLET | Freq: Four times a day (QID) | ORAL | Status: DC
  Administered 2017-04-06 – 2017-04-08 (×7): 600 mg via ORAL
  Filled 2017-04-06 (×7): qty 1

## 2017-04-06 MED ORDER — ZOLPIDEM TARTRATE 5 MG PO TABS: 5.0000 mg | ORAL_TABLET | Freq: Every evening | ORAL | Status: DC | PRN

## 2017-04-06 MED ORDER — DIPHENHYDRAMINE HCL 50 MG/ML IJ SOLN: 12.5000 mg | INTRAMUSCULAR | Status: DC | PRN

## 2017-04-06 MED ORDER — OXYTOCIN 40 UNITS IN LACTATED RINGERS INFUSION - SIMPLE MED
1.0000 m[IU]/min | INTRAVENOUS | Status: DC
  Administered 2017-04-06: 2 m[IU]/min via INTRAVENOUS
  Filled 2017-04-06: qty 1000

## 2017-04-06 MED ORDER — ONDANSETRON HCL 4 MG/2ML IJ SOLN: 4.0000 mg | INTRAMUSCULAR | Status: DC | PRN

## 2017-04-06 MED ORDER — DIPHENHYDRAMINE HCL 25 MG PO CAPS: 25.0000 mg | ORAL_CAPSULE | Freq: Four times a day (QID) | ORAL | Status: DC | PRN

## 2017-04-06 MED ORDER — PHENYLEPHRINE 40 MCG/ML (10ML) SYRINGE FOR IV PUSH (FOR BLOOD PRESSURE SUPPORT): 80.0000 ug | PREFILLED_SYRINGE | INTRAVENOUS | Status: DC | PRN

## 2017-04-06 MED ORDER — DIBUCAINE 1 % RE OINT: 1.0000 "application " | TOPICAL_OINTMENT | RECTAL | Status: DC | PRN

## 2017-04-06 MED ORDER — LACTATED RINGERS IV SOLN: 500.0000 mL | Freq: Once | INTRAVENOUS | Status: DC

## 2017-04-06 MED ORDER — PHENYLEPHRINE 40 MCG/ML (10ML) SYRINGE FOR IV PUSH (FOR BLOOD PRESSURE SUPPORT)
80.0000 ug | PREFILLED_SYRINGE | INTRAVENOUS | Status: DC | PRN
Start: 2017-04-06 — End: 2017-04-06

## 2017-04-06 NOTE — H&P (Signed)
LABOR AND DELIVERY ADMISSION HISTORY AND PHYSICAL NOTE  Lynnell DikeKristell Murias-Sanchez is a 27 y.o. female G3P1011 with IUP at 5566w3d with Estimated Date of Delivery: 04/10/17 by 10-wk U/S presenting for SOL.  She reports positive fetal movement. She denies leakage of fluid or vaginal bleeding.  [facility] Pregnancy complications:  - Pyelonephritis in first trimester - Failed Glucola, normal 3-hr GTT  Past Medical History: Past Medical History:  Diagnosis Date  . Medical history non-contributory   . No pertinent past medical history     Past Surgical History: Past Surgical History:  Procedure Laterality Date  . NO PAST SURGERIES      Obstetrical History: OB History    Gravida Para Term Preterm AB Living   3 1 1  0 1 1   SAB TAB Ectopic Multiple Live Births   1 0 0 0 1      Social History: Social History   Socioeconomic History  . Marital status: Single    Spouse name: None  . Number of children: None  . Years of education: None  . Highest education level: None  Social Needs  . Financial resource strain: None  . Food insecurity - worry: None  . Food insecurity - inability: None  . Transportation needs - medical: None  . Transportation needs - non-medical: None  Occupational History  . None  Tobacco Use  . Smoking status: Never Smoker  . Smokeless tobacco: Never Used  Substance and Sexual Activity  . Alcohol use: No  . Drug use: No  . Sexual activity: Yes    Birth control/protection: None  Other Topics Concern  . None  Social History Narrative   ** Merged History Encounter **        Family History: Family History  Problem Relation Age of Onset  . Early death Father     Allergies: No Known Allergies  Medications Prior to Admission  Medication Sig Dispense Refill Last Dose  . Prenatal MV-Min-Fe Fum-FA-DHA ([employer] WOMENS PRENATAL+DHA) 28-0.975 & 200 MG MISC Take 1 tablet by mouth daily. 60 each 0 04/05/2017 at Unknown time   . clotrimazole (CLOTRIMAZOLE-7) 1 % vaginal cream Place 1 Applicatorful vaginally at bedtime. For 7 days (Patient not taking: Reported on 04/04/2017) 45 g 0 Not Taking at Unknown time  . cyclobenzaprine (FLEXERIL) 5 MG tablet Take 1 tablet (5 mg total) by mouth every 6 (six) hours as needed for muscle spasms. (Patient not taking: Reported on 04/04/2017) 30 tablet 0 Not Taking at Unknown time  . docusate sodium (COLACE) 100 MG capsule Take 1 capsule (100 mg total) by mouth daily. (Patient not taking: Reported on 04/04/2017) 10 capsule 0 Not Taking at Unknown time     Review of Systems  All systems reviewed and negative except as stated in HPI  Physical Exam Blood pressure 115/79, pulse 88, temperature 97.6 F (36.4 C), temperature source Oral, height 5\' 2"  (1.575 m), last menstrual period 07/07/2016, SpO2 99 %, unknown if currently breastfeeding. General appearance: alert, oriented, breathing through contractions Lungs: normal respiratory effort Heart: regular rate Abdomen: soft, non-tender; gravid, FH appropriate for GA Extremities: No calf swelling or tenderness Presentation: cephalic Fetal monitoring: baseline rate 135, moderate variability, +acel, no decel Uterine activity: not tracing well; pt breathing through ctx every 2-3 min Dilation: 4.5 Effacement (%): 80 Station: -2 Exam by:: lauren fields rn   Prenatal labs: ABO, Rh: O/Positive/-- (10/16 1023) Antibody: Negative (10/16 1023) Rubella: 6.89 (10/16 1023) RPR: Non Reactive (12/19 1510)  HBsAg: Negative (10/16 1023)  HIV: Non Reactive (12/19 1510)  GC/Chlamydia: negative (03/14/17) GBS: Negative (02/14 0000)  1-hr GTT: 139 --> 3-hr GTT normal (76, 157, 109, 89) Genetic screening:  Not done Anatomy US: normal anatomy, female  Prenatal Transfer Tool  Maternal Diabetes: No Genetic Screening:  n/a Maternal Ultrasounds/Referrals: Normal Fetal Ultrasounds or other Referrals:  None Maternal Substance Abuse:  No Significant  Maternal Medications:  None Significant Maternal Lab Results: Lab values include: Group B Strep negative  No results found for this or any previous visit (from the past 24 hour(s)).  Patient Active Problem List   Diagnosis Date Noted  . Supervision of low-risk pregnancy, third trimester 11/19/2016  . Pyelonephritis affecting pregnancy in first trimester 09/24/2016    Assessment: Nyaisha Simao is a 27 y.o. G3P1011 at [redacted]w[redacted]d here for SOL.  #Labor: Expectant management #Pain: Would like epidural #FWB: Cat I #ID:  GBS negative #MOF: breast and bottle #MOC: POPs vs Depo #Circ:  n/a (girl)  Kandra Nicolas Degele 04/06/2017, 6:16 AM

## 2017-04-06 NOTE — Progress Notes (Signed)
Labor Progress Note Lynnell DikeKristell Murias-Sanchez is a 27 y.o. G3P1011 at 457w3d who presents for SOL.  S: Patient is comfortable and without pain.   O:  BP (!) 77/59   Pulse 79   Temp 98 F (36.7 C) (Oral)   Resp 15   Ht 5\' 2"  (1.575 m)   Wt 49.9 kg (110 lb)   LMP 07/07/2016   SpO2 99%   BMI 20.12 kg/m   WGN:FAOZHYQMFHT:baseline rate 140, moderate variability, + acels, variable decels Toco: ctx every 1-3 min   CVE: Dilation: 9 Effacement (%): 100 Cervical Position: Middle Station: -1, -2 Presentation: Vertex Exam by:: s grindstaff rn    A&P: 27 y.o. [id number] 657w3d who presents for SOL.  #Labor: Progressing well. Continue with expectant management #Pain: well controlled on epidural #FWB: category II #GBS negative #MOF:breast and bottle #MOC:POPs vs Depo #Circ:n/a (girl)     Lillette BoxerStephanie R Taiana Temkin, Medical Student 4:56 PM

## 2017-04-06 NOTE — Anesthesia Procedure Notes (Signed)
Epidural Patient location during procedure: OB Start time: 04/06/2017 8:13 AM End time: 04/06/2017 8:35 AM  Staffing Anesthesiologist: Jairo BenJackson, [place] Stanger, MD Performed: anesthesiologist   Preanesthetic Checklist Completed: patient identified, surgical consent, pre-op evaluation, timeout performed, IV checked, risks and benefits discussed and monitors and equipment checked  Epidural Patient position: sitting Prep: site prepped and draped and DuraPrep Patient monitoring: blood pressure, continuous pulse ox and heart rate Approach: midline Location: L3-L4 Injection technique: LOR air  Needle:  Needle type: Tuohy  Needle gauge: 17 G Needle length: 9 cm Needle insertion depth: 4 cm Catheter type: closed end flexible Catheter size: 19 Gauge Catheter at skin depth: 9 cm Test dose: negative (1% lidocaine)  Assessment Events: blood not aspirated, injection not painful, no injection resistance, negative IV test and no paresthesia  Additional Notes Pt identified in Labor room.  Monitors applied. Working IV access confirmed. Sterile prep, drape lumbar spine.  1% lido local L 3,4.  #17ga Touhy LOR air at 4 cm L 3,4, cath in easily to 9 cm skin. Test dose OK, cath dosed and infusion begun.  Patient asymptomatic, VSS, no heme aspirated, tolerated well.  Amber Murphy, MDReason for block:procedure for pain

## 2017-04-06 NOTE — Anesthesia Preprocedure Evaluation (Addendum)
Anesthesia Evaluation  Patient identified by MRN, date of birth, ID band Patient awake    Reviewed: Allergy & Precautions, NPO status , Patient's Chart, lab work & pertinent test results  History of Anesthesia Complications Negative for: history of anesthetic complications  Airway Mallampati: II  TM Distance: >3 FB Neck ROM: Full    Dental  (+) Dental Advisory Given   Pulmonary neg pulmonary ROS,    breath sounds clear to auscultation       Cardiovascular negative cardio ROS   Rhythm:Regular Rate:Normal     Neuro/Psych negative neurological ROS     GI/Hepatic negative GI ROS, Neg liver ROS,   Endo/Other  negative endocrine ROS  Renal/GU negative Renal ROS     Musculoskeletal   Abdominal   Peds  Hematology plt 230k   Anesthesia Other Findings   Reproductive/Obstetrics (+) Pregnancy                            Anesthesia Physical Anesthesia Plan  ASA: II  Anesthesia Plan: Epidural   Post-op Pain Management:    Induction:   PONV Risk Score and Plan: Treatment may vary due to age or medical condition  Airway Management Planned: Natural Airway  Additional Equipment:   Intra-op Plan:   Post-operative Plan:   Informed Consent: I have reviewed the patients History and Physical, chart, labs and discussed the procedure including the risks, benefits and alternatives for the proposed anesthesia with the patient or authorized representative who has indicated his/her understanding and acceptance.   Dental advisory given  Plan Discussed with:   Anesthesia Plan Comments: (Patient identified. Risks/Benefits/Options discussed with patient including but not limited to bleeding, infection, nerve damage, paralysis, failed block, incomplete pain control, headache, blood pressure changes, nausea, vomiting, reactions to medication both or allergic, itching and postpartum back pain. Confirmed with  bedside nurse the patient's most recent platelet count. Confirmed with patient that they are not currently taking any anticoagulation, have any bleeding history or any family history of bleeding disorders. Patient expressed understanding and wished to proceed. All questions were answered. )        Anesthesia Quick Evaluation

## 2017-04-06 NOTE — MAU Note (Signed)
Pt presents to MAU c/o ctxs every 2-5 min. Pt reports no vaginal bleeding or LOF. +FM. Pt was seen yesterday at the clinic and was 3cm dilated. Pt reports no problems in the pregnancy.

## 2017-04-06 NOTE — Progress Notes (Signed)
Labor Progress Note Amber DikeKristell Murphy is a 27 y.o. G3P1011 at 1954w3d who presents for SOL.  S: Patient is comfortable and doing well.  O:  BP 102/71   Pulse 70   Temp 97.8 F (36.6 C) (Oral)   Resp 20   Ht 5\' 2"  (1.575 m)   Wt 49.9 kg (110 lb)   LMP 07/07/2016   SpO2 99%   BMI 20.12 kg/m   ZOX:WRUEAVWUFHT:baseline rate 130, moderate variability, + acels, - decels Toco: ctx irregular every 3-237min   CVE: Dilation: 6 Effacement (%): 80 Cervical Position: Middle Station: -1 Presentation: Vertex Exam by:: S Grindstaff rn    A&P: 27 y.o. [id number] 3054w3d who presents for SOL. #Labor: Progressing well. Pitocin @ 6mu #Pain: Controlled on epidural #FWB: category I #GBS negative  #MOF: breast and bottle #MOC: POPs vs Depo #Circ:   n/a (girl)  Lillette BoxerStephanie R Arihanna Murphy, Medical Student 12:11 PM

## 2017-04-07 NOTE — Anesthesia Postprocedure Evaluation (Signed)
Anesthesia Post Note  Patient: Teacher, adult educationKristell Murphy  Procedure(s) Performed: AN AD HOC LABOR EPIDURAL     Patient location during evaluation: Mother Baby Anesthesia Type: Epidural Level of consciousness: awake and alert, oriented and patient cooperative Pain management: pain level controlled Vital Signs Assessment: post-procedure vital signs reviewed and stable Respiratory status: spontaneous breathing Cardiovascular status: stable Postop Assessment: no headache, epidural receding, patient able to bend at knees and no signs of nausea or vomiting Anesthetic complications: no Comments: Pain score 0.    Last Vitals:  Vitals:   04/07/17 0109 04/07/17 0509  BP: (!) 92/59 (!) 89/60  Pulse: 83 70  Resp: 16 18  Temp: 36.9 C 36.4 C  SpO2:      Last Pain:  Vitals:   04/07/17 0511  TempSrc:   PainSc: 0-No pain   Pain Goal: Patients Stated Pain Goal: 1 (04/06/17 0650)               Merrilyn PumaWRINKLE,[name]

## 2017-04-07 NOTE — Progress Notes (Signed)
POSTPARTUM PROGRESS NOTE  Post Partum Day 1 Subjective:  Amber Murphy is a 27 y.o. [id number] 3962w3d s/p SVD.  No acute events overnight.  Pt denies problems with ambulating, voiding or po intake.  She denies nausea or vomiting.  Pain is well controlled. Lochia Minimal.   Objective: Blood pressure (!) 89/60, pulse 70, temperature 97.6 F (36.4 C), temperature source Oral, resp. rate 18, height 5\' 2"  (1.575 m), weight 49.9 kg (110 lb), last menstrual period 07/07/2016, SpO2 99 %, unknown if currently breastfeeding.  Physical Exam:  General: alert, cooperative and no distress Lochia:normal flow Chest: no respiratory distress Heart:regular rate, distal pulses intact Abdomen: soft, nontender,  Uterine Fundus: firm, appropriately tender DVT Evaluation: No calf swelling or tenderness Extremities: no edema  Recent Labs    04/06/17 0622  HGB 13.5  HCT 38.4    Assessment/Plan:  ASSESSMENT: Amber Murphy is a 27 y.o. [id number] 4262w3d s/p SVD  Plan for discharge tomorrow. Plan for depo.   LOS: 1 day   Chastity Noland MossDO 04/07/2017, 7:13 AM

## 2017-04-07 NOTE — Lactation Note (Signed)
This note was copied from a baby's chart. Lactation Consultation Note Baby 11 hrs old. Baby has been spitty since delivery. RN stated this is the first real feeding baby has had.  Mom has 28 yr old that she didn't BF. Mom plans to breast/formula feed. Discussed supply and demand, encouraged to BF first and don't supplement w/formula first, only if baby acts hungry. Encouraged BF. Discussed cluster feeding, STS, I&O. Mom encouraged to feed baby 8-12 times/24 hours and with feeding cues.  Mom has everted nipples. Hand expression taught w/colostrum noted.  Encouraged to call for assistance. Mom denied need of interpreter. WH/LC brochure given w/resources, support groups and LC services.  Patient Name: Amber Murphy ZOXWR'UToday's Date: 04/07/2017 Reason for consult: Initial assessment   Maternal Data Has patient been taught Hand Expression?: Yes Does the patient have breastfeeding experience prior to this delivery?: No  Feeding Feeding Type: Breast Fed Length of feed: 15 min(still BF)  LATCH Score Latch: Grasps breast easily, tongue down, lips flanged, rhythmical sucking.  Audible Swallowing: A few with stimulation  Type of Nipple: Everted at rest and after stimulation  Comfort (Breast/Nipple): Soft / non-tender  Hold (Positioning): Assistance needed to correctly position infant at breast and maintain latch.  LATCH Score: 8  Interventions Interventions: Breast feeding basics reviewed;Support pillows;Assisted with latch;Position options;Skin to skin;Breast massage;Hand express;Breast compression;Adjust position  Lactation Tools Discussed/Used WIC Program: Yes   Consult Status Consult Status: Follow-up Date: 04/08/17 Follow-up type: In-patient    Amber DancerCARVER, Cheila Wickstrom Murphy 04/07/2017, 5:33 AM

## 2017-04-08 MED ORDER — IBUPROFEN 600 MG PO TABS: 600.0000 mg | ORAL_TABLET | Freq: Four times a day (QID) | ORAL | 0 refills | Status: DC

## 2017-04-08 NOTE — Progress Notes (Signed)
Patient wants to order her own meals. Eda H Royal Interpreter.

## 2017-04-08 NOTE — Discharge Summary (Signed)
OB Discharge Summary    Patient Name: Amber Murphy DOB: 06-01-1990 MRN: [id number] Date of admission: 04/06/2017  Delivering MD: Thressa Sheller D )  Date of discharge: 04/08/2017  Admitting diagnosis: SOL Intrauterine pregnancy: [redacted]w[redacted]d    Secondary diagnosis:  Active Problems:   Patient Active Problem List   Diagnosis Date Noted  . Normal labor 04/06/2017  . Supervision of low-risk pregnancy, third trimester 11/19/2016  . Pyelonephritis affecting pregnancy in first trimester 09/24/2016   Additional problems:  Failed glucola test, normal 3 hr GTT     Discharge diagnosis: Term Pregnancy Delivered                                                                                                Post partum procedures:None  Complications: fetal bradycardia immediately prior to delivery  Hospital course:  Onset of Labor With Vaginal Delivery     27 y.o. yo [id number] at [redacted]w[redacted]d was admitted in Active Labor on 04/06/2017. Patient had an uncomplicated labor course as follows:  Membrane Rupture Time/Date: 6:02 PM ,04/06/2017   Intrapartum Procedures: Episiotomy: None [1]                                         Lacerations:  2nd degree [3];Perineal [11]  Patient had a delivery of a Viable infant. 04/06/2017  Information for the patient's newborn:  Aubery, Date Girl Iesha [[id number]]  Delivery Method: Vaginal, Spontaneous(Filed from Delivery Summary)    Pateint had an uncomplicated postpartum course.  She is ambulating, tolerating a regular diet, passing flatus, and urinating well. Patient is discharged home in stable condition on 04/08/17.   Physical exam  Vitals:   04/07/17 1800 04/08/17 0517  BP: 99/64 96/67  Pulse: 73 62  Resp: 19 18  Temp: 98.4 F (36.9 C) 97.7 F (36.5 C)  SpO2:  100%    General: alert, cooperative and no distress Lochia: appropriate Uterine Fundus: firm Incision: N/A DVT Evaluation: No evidence of DVT seen on physical exam.  Labs: No results  found for this or any previous visit (from the past 24 hour(s)).   Discharge instruction: per After Visit Summary and "Baby and Me Booklet".  After visit meds:  No Known Allergies  Allergies as of 04/08/2017   No Known Allergies     Medication List    TAKE these medications   [employer] WOMENS PRENATAL+DHA 28-0.975 & 200 MG Misc Take 1 tablet by mouth daily.   ibuprofen 600 MG tablet Commonly known as:  ADVIL,MOTRIN Take 1 tablet (600 mg total) by mouth every 6 (six) hours.      Diet: routine diet  Activity: Advance as tolerated. Pelvic rest for 6 weeks.   Outpatient follow up:6 weeks Future Appointments:  Future Appointments  Date Time Provider Department Center  04/11/2017  3:50 PM Beaulah Dinning, MD [facility]  04/12/2017  3:30 PM [facility] [place] [facility]    Follow up Appt: No Follow-up on file.  Postpartum contraception: POPs vs depo  Newborn Data: APGAR (  1 MIN): 9   APGAR (5 MINS): 9    Baby Feeding: Bottle and Breast Disposition:home with mother  Ellwood Denselison Clarice Bonaventure, DO  04/08/2017

## 2017-04-08 NOTE — Discharge Instructions (Signed)
Vaginal Delivery, Care After °Refer to this sheet in the next few weeks. These instructions provide you with information about caring for yourself after vaginal delivery. Your health care provider may also give you more specific instructions. Your treatment has been planned according to current medical practices, but problems sometimes occur. Call your health care provider if you have any problems or questions. °What can I expect after the procedure? °After vaginal delivery, it is common to have: °· Some bleeding from your vagina. °· Soreness in your abdomen, your vagina, and the area of skin between your vaginal opening and your anus (perineum). °· Pelvic cramps. °· Fatigue. ° °Follow these instructions at home: °Medicines °· Take over-the-counter and prescription medicines only as told by your health care provider. °· If you were prescribed an antibiotic medicine, take it as told by your health care provider. Do not stop taking the antibiotic until it is finished. °Driving ° °· Do not drive or operate heavy machinery while taking prescription pain medicine. °· Do not drive for 24 hours if you received a sedative. °Lifestyle °· Do not drink alcohol. This is especially important if you are breastfeeding or taking medicine to relieve pain. °· Do not use tobacco products, including cigarettes, chewing tobacco, or e-cigarettes. If you need help quitting, ask your health care provider. °Eating and drinking °· Drink at least 8 eight-ounce glasses of water every day unless you are told not to by your health care provider. If you choose to breastfeed your baby, you may need to drink more water than this. °· Eat high-fiber foods every day. These foods may help prevent or relieve constipation. High-fiber foods include: °? Whole grain cereals and breads. °? Brown rice. °? Beans. °? Fresh fruits and vegetables. °Activity °· Return to your normal activities as told by your health care provider. Ask your health care provider  what activities are safe for you. °· Rest as much as possible. Try to rest or take a nap when your baby is sleeping. °· Do not lift anything that is heavier than your baby or 10 lb (4.5 kg) until your health care provider says that it is safe. °· Talk with your health care provider about when you can engage in sexual activity. This may depend on your: °? Risk of infection. °? Rate of healing. °? Comfort and desire to engage in sexual activity. °Vaginal Care °· If you have an episiotomy or a vaginal tear, check the area every day for signs of infection. Check for: °? More redness, swelling, or pain. °? More fluid or blood. °? Warmth. °? Pus or a bad smell. °· Do not use tampons or douches until your health care provider says this is safe. °· Watch for any blood clots that may pass from your vagina. These may look like clumps of dark red, brown, or black discharge. °General instructions °· Keep your perineum clean and dry as told by your health care provider. °· Wear loose, comfortable clothing. °· Wipe from front to back when you use the toilet. °· Ask your health care provider if you can shower or take a bath. If you had an episiotomy or a perineal tear during labor and delivery, your health care provider may tell you not to take baths for a certain length of time. °· Wear a bra that supports your breasts and fits you well. °· If possible, have someone help you with household activities and help care for your baby for at least a few days after   you leave the hospital. °· Keep all follow-up visits for you and your baby as told by your health care provider. This is important. °Contact a health care provider if: °· You have: °? Vaginal discharge that has a bad smell. °? Difficulty urinating. °? Pain when urinating. °? A sudden increase or decrease in the frequency of your bowel movements. °? More redness, swelling, or pain around your episiotomy or vaginal tear. °? More fluid or blood coming from your episiotomy or  vaginal tear. °? Pus or a bad smell coming from your episiotomy or vaginal tear. °? A fever. °? A rash. °? Little or no interest in activities you used to enjoy. °? Questions about caring for yourself or your baby. °· Your episiotomy or vaginal tear feels warm to the touch. °· Your episiotomy or vaginal tear is separating or does not appear to be healing. °· Your breasts are painful, hard, or turn red. °· You feel unusually sad or worried. °· You feel nauseous or you vomit. °· You pass large blood clots from your vagina. If you pass a blood clot from your vagina, save it to show to your health care provider. Do not flush blood clots down the toilet without having your health care provider look at them. °· You urinate more than usual. °· You are dizzy or light-headed. °· You have not breastfed at all and you have not had a menstrual period for 12 weeks after delivery. °· You have stopped breastfeeding and you have not had a menstrual period for 12 weeks after you stopped breastfeeding. °Get help right away if: °· You have: °? Pain that does not go away or does not get better with medicine. °? Chest pain. °? Difficulty breathing. °? Blurred vision or spots in your vision. °? Thoughts about hurting yourself or your baby. °· You develop pain in your abdomen or in one of your legs. °· You develop a severe headache. °· You faint. °· You bleed from your vagina so much that you fill two sanitary pads in one hour. °This information is not intended to replace advice given to you by your health care provider. Make sure you discuss any questions you have with your health care provider. °Document Released: 01/13/2000 Document Revised: 06/29/2015 Document Reviewed: 01/30/2015 °Elsevier Interactive Patient Education © 2018 Elsevier Inc. ° °

## 2017-04-08 NOTE — Lactation Note (Signed)
This note was copied from a baby's chart. Lactation Consultation Note  Patient Name: Amber Lynnell DikeKristell Murphy RUEAV'WToday's Date: 04/08/2017 Reason for consult: Follow-up assessment   Visited mom for follow-up prior to discharge.  Infant had been breastfeeding but mother gave a bottle in the early a.m. and told night shift LC that she wanted to change to bottle feeding.  LC to confirm that this was mother's intent.  Mother verbalized that she wanted to switch to bottle.  Instructed to call LC if she changes her mind and LC to assist. Maternal Data    Feeding Feeding Type: Bottle Fed - Formula Nipple Type: Slow - flow  LATCH Score Latch: (mom decieded to switch to formula only )                 Interventions    Lactation Tools Discussed/Used     Consult Status Consult Status: Complete Date: 04/08/17    Irene PapBeth R Zylie Mumaw 04/08/2017, 8:39 AM

## 2017-04-08 NOTE — Progress Notes (Signed)
Patient did not want to order breakfast. Eda H Royal Interpreter.

## 2017-04-11 ENCOUNTER — Encounter: Payer: Self-pay | Admitting: Family Medicine

## 2017-04-12 ENCOUNTER — Encounter (HOSPITAL_COMMUNITY): Payer: Self-pay

## 2017-04-12 ENCOUNTER — Ambulatory Visit (HOSPITAL_COMMUNITY)
Admission: RE | Admit: 2017-04-12 | Discharge: 2017-04-12 | Disposition: A | Discharge status: Home / Self Care | Payer: Self-pay | Source: Ambulatory Visit | Attending: Family Medicine | Admitting: Family Medicine

## 2017-05-24 ENCOUNTER — Ambulatory Visit: Payer: Self-pay | Admitting: Family Medicine

## 2017-06-12 ENCOUNTER — Other Ambulatory Visit: Payer: Self-pay

## 2017-06-12 ENCOUNTER — Encounter: Payer: Self-pay | Admitting: Family Medicine

## 2017-06-12 ENCOUNTER — Ambulatory Visit (INDEPENDENT_AMBULATORY_CARE_PROVIDER_SITE_OTHER): Payer: Self-pay | Admitting: Family Medicine

## 2017-06-12 DIAGNOSIS — M25531 Pain in right wrist: Secondary | ICD-10-CM

## 2017-06-12 MED ORDER — DESOGESTREL-ETHINYL ESTRADIOL 0.15-30 MG-MCG PO TABS: 1.0000 | ORAL_TABLET | Freq: Every day | ORAL | 11 refills | Status: DC

## 2017-06-12 MED ORDER — MELOXICAM 15 MG PO TABS: 15.0000 mg | ORAL_TABLET | Freq: Every day | ORAL | 0 refills | Status: DC

## 2017-06-12 NOTE — Patient Instructions (Addendum)
Tenosinovitis de De Quervain  (De Quervain Tenosynovitis)  Los tendones unen los msculos con los huesos y tambin intervienen en los movimientos de las articulaciones. La irritacin o la hinchazn de los tendones se conoce como tendinitis.  El tendn extensor corto del pulgar (ECP) conecta el msculo extensor corto del pulgar con un hueso que se encuentra cerca de la base de este dedo. El msculo extensor corto del pulgar ayuda a enderezar y extender este dedo. La tenosinovitis de De Quervain es una afeccin que se caracteriza por la irritacin, el engrosamiento y la hinchazn de la membrana (vaina) que recubre el tendn. A esta afeccin a menudo se la denomina tenosinovitis estenosante. Causa dolor en el dorso de la mueca del lado en el que se encuentra el pulgar.  CAUSAS  Las causas de esta afeccin incluyen lo siguiente:   Las actividades que requieren la extensin reiterada del pulgar y la mueca.   Un aumento repentino en la actividad o un cambio en la actividad que afecta la mueca.  FACTORES DE RIESGO:  Es ms probable que esta afeccin se manifieste en:   Las mujeres.   Los diabticos.   Las mujeres que acaban de parir.   Las personas mayores de 40aos.   Las personas que hacen actividades que incluyen la realizacin de movimientos reiterados con la mano y la mueca, como el tenis, el rquetbol, el voleibol, la jardinera y el cuidado de nios.   Las personas que realizan trabajos pesados.   Las personas con poca fuerza y flexibilidad en la mueca.   Las personas que no precalientan correctamente antes de las actividades.  SNTOMAS  Los sntomas de esta afeccin incluyen lo siguiente:   Dolor o sensibilidad en el dorso de la mueca del lado en el que se encuentra el pulgar, cuando el pulgar y la mueca estn inmviles.   Dolor que se intensifica al enderezar el pulgar o al extender el pulgar, o la mueca.   Dolor al tocar la zona de la lesin.   Bloqueo o atascamiento de la articulacin del  pulgar al flexionar y enderezar este dedo.   Prdida de la movilidad del pulgar debido al dolor.   Hinchazn de la zona afectada.  DIAGNSTICO  Esta afeccin se diagnostica mediante la historia clnica y un examen fsico. El mdico le pedir los detalles de la lesin y le har preguntas sobre los sntomas.  TRATAMIENTO  Puede incluir el uso de hielo y medicamentos para aliviar el dolor y reducir la hinchazn. Adems, pueden recomendarle que use una frula o un dispositivo ortopdico para limitar el movimiento del pulgar y de la mueca. Cuando los casos no revisten gravedad, el tratamiento tambin puede incluir trabajar con un fisioterapeuta para fortalecer la mueca y aliviar la irritacin alrededor de la vaina del tendn extensor corto del pulgar (ECP). Cuando los casos son graves, tal vez haya que realizar una ciruga.  INSTRUCCIONES PARA EL CUIDADO EN EL HOGAR  Si tiene una frula o un dispositivo ortopdico:   selos como se lo haya indicado el mdico. Quteselos solamente como se lo haya indicado el mdico.   Afloje la frula o el dispositivo ortopdico si los dedos de las manos se le adormecen, siente hormigueos o se le enfran y se tornan de color azul.   Mantenga la frula o el dispositivo ortopdico limpios y secos.  Control del dolor, la rigidez y la hinchazn   Si se lo indican, aplique hielo sobre la zona lesionada.  ? Ponga   el hielo en una bolsa plstica.  ? Coloque una toalla entre la piel y la bolsa de hielo.  ? Coloque el hielo durante 20minutos, 2 a 3veces por da.   Mueva los dedos con frecuencia para evitar la rigidez y reducir la hinchazn.   Cuando est sentado o acostado, eleve la zona de la lesin por encima del nivel del corazn.  Instrucciones generales   Reanude sus actividades normales como se lo haya indicado el mdico. Pregntele al mdico qu actividades son seguras para usted.   Tome los medicamentos de venta libre y los recetados solamente como se lo haya indicado el  mdico.   Concurra a todas las visitas de control como se lo haya indicado el mdico. Esto es importante.   No conduzca ni opere maquinaria pesada mientras toma analgsicos recetados.  SOLICITE ATENCIN MDICA SI:   El dolor, la sensibilidad o la hinchazn aumentan, aunque haya recibido tratamiento.   La mueca, la mano o los dedos del lado de la lesin se le adormecen o siente hormigueos.  Esta informacin no tiene como fin reemplazar el consejo del mdico. Asegrese de hacerle al mdico cualquier pregunta que tenga.  Document Released: 11/01/2005 Document Revised: 10/06/2014 Document Reviewed: 03/23/2014  Elsevier Interactive Patient Education  2018 [employer]vier Inc.

## 2017-06-12 NOTE — Progress Notes (Signed)
    Subjective:    Patient ID: Amber Murphy, female    DOB: Feb 09, 1990, 27 y.o.   MRN: [id number]   CC: postpartum follow up, wrist pain  Contraception- wants OCP Not breast feeding [place]: neg Baby is doing well and she is feeling well overall.   Right wrist pain- occurred after having baby, pain with lifting baby, abducting thumb away from wrist. She has used ice, ibuprofen, rest, and thumb splint without much improvement. She reports her friend had something similar and had it injected with improvement of the pain and she is interested in this however has no insurance and would not be able to afford a large bill.  Smoking status reviewed- non-smoker  Review of Systems- no vaginal bleeding, denies depression   Objective:  BP 94/60   Pulse 76   Temp 98.3 F (36.8 C) (Oral)   Ht  (1.575 m)   Wt 139 lb 9.6 oz (63.3 kg)   LMP 05/19/2017 (Exact Date)   SpO2 99%   Breastfeeding? No   BMI 25.53 kg/m  Vitals and nursing note reviewed  General: well nourished, in no acute distress Neck: supple, non-tender, without lymphadenopathy Cardiac: RRR, clear S1 and S2, no murmurs, rubs, or gallops Respiratory: clear to auscultation bilaterally, no increased work of breathing Extremities: no edema or cyanosis. Right wrist without swelling or obvious deformity. Tender to palpation over base of right thumb with positive Finkelstein's test.  Skin: warm and dry, no rashes noted Neuro: alert and oriented, no focal deficits   Assessment & Plan:    1. Routine postpartum follow-up OCPs prescribed for contraception Patient due for pap however she is self pay and cannot afford, she can go to [facility] to get this done for little to no cost.  2. Right wrist pain Consistent with de quervain's tenosynovitis. Discussed ice, rest. Rx given for mobic to take for 2 weeks, can repeat for 2 weeks if needed. Thumb spica splint given in office. Follow up as needed, can refer to sports med if  patient prefers.   Return as needed.   Dolores Patty, DO Family Medicine Resident PGY-2

## 2017-11-02 IMAGING — US US OB COMP LESS 14 WK
1 series · 15 of 21 positions shown · non-contrast
Comparison: 08/10/2016

CLINICAL DATA: Vaginal bleeding since yesterday. Past golf ball
sized clot. Bleeding has stopped today at noon.

EXAM:
OBSTETRIC <14 WK ULTRASOUND
TECHNIQUE: Transvaginal ultrasound was performed for complete evaluation of the
gestation as well as the maternal uterus, adnexal regions, and
pelvic cul-de-sac.

[Series 1: us ob comp less 14 wk · 21 acquisitions, 15 frames shown]
[im 1/21]
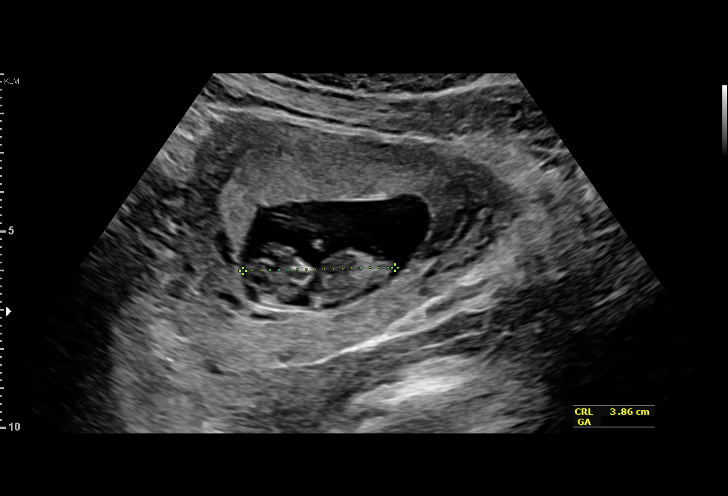
[im 3/21]
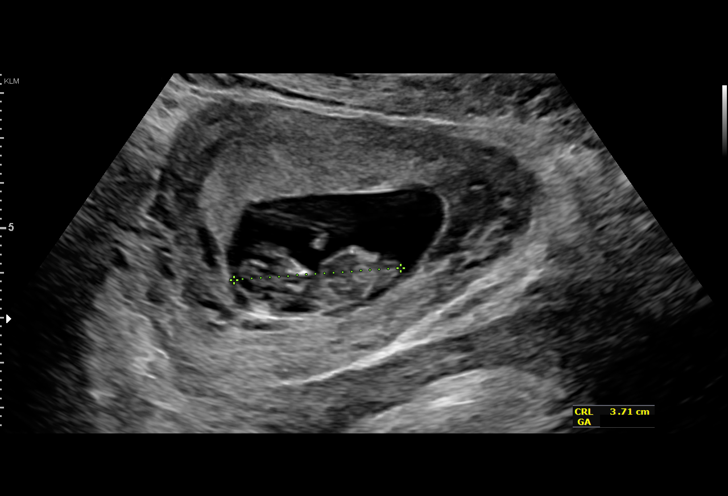
[im 4/21]
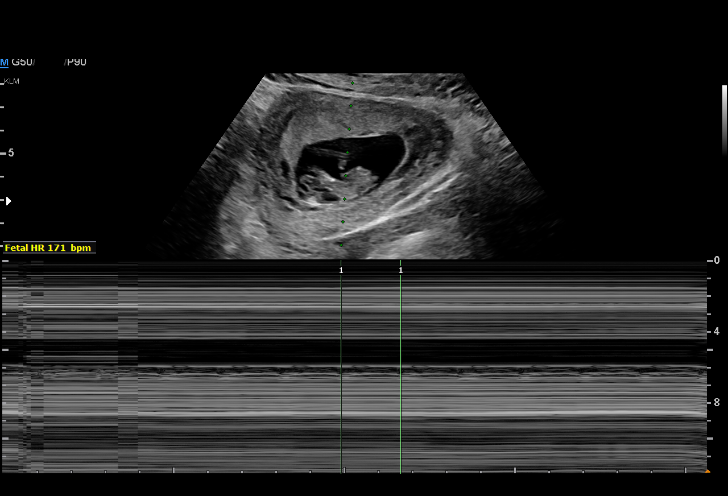
[im 5/21]
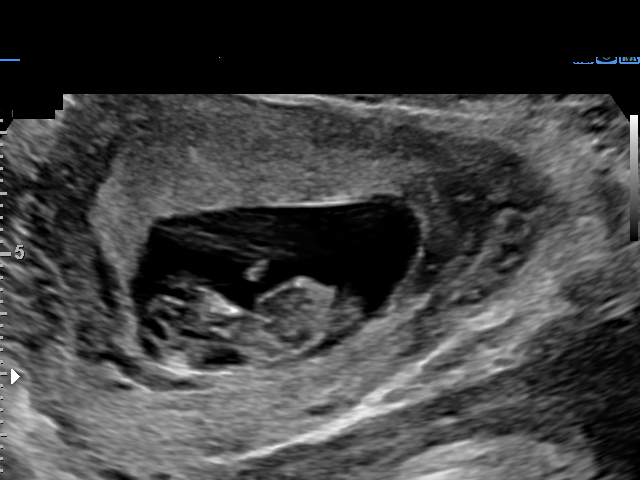
[im 7/21]
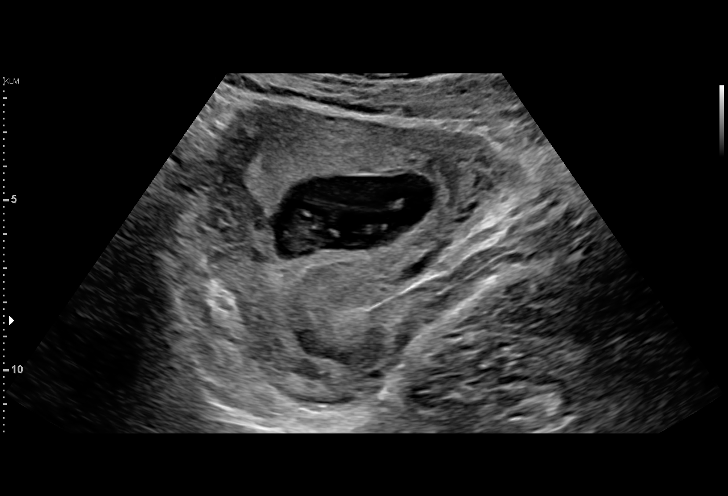
[im 8/21]
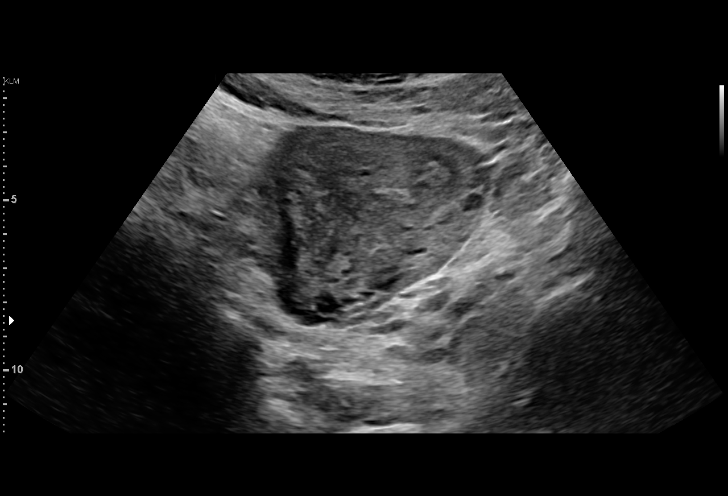
[im 10/21]
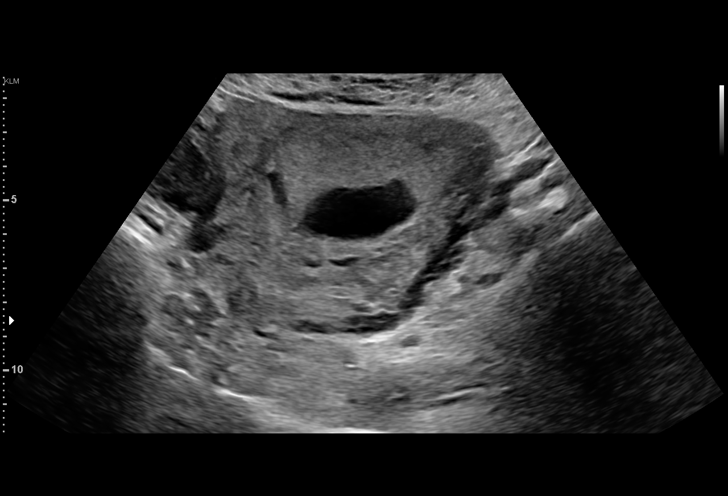
[im 11/21]
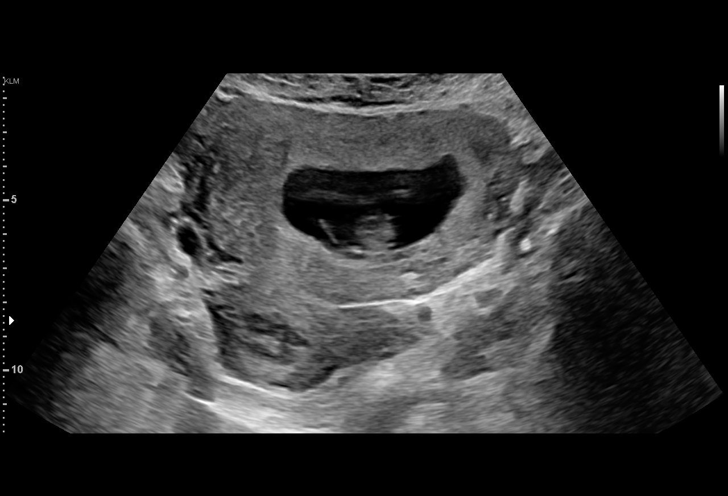
[im 12/21]
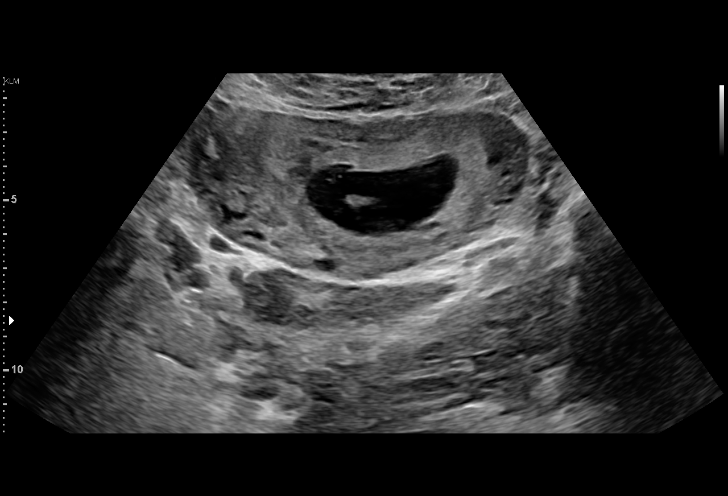
[im 14/21]
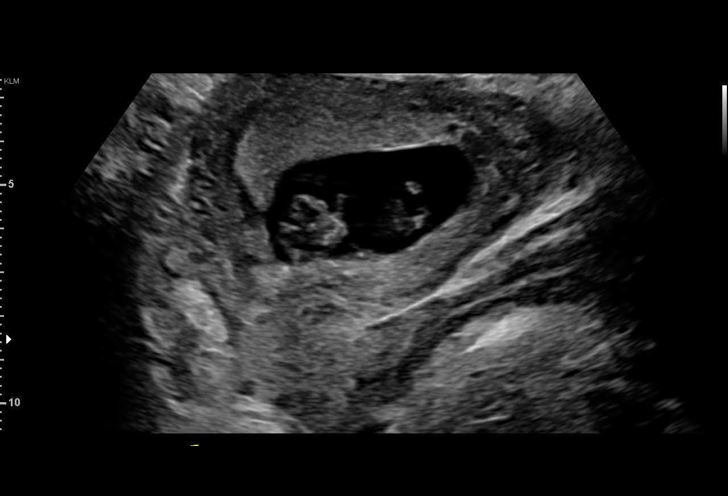
[im 15/21]
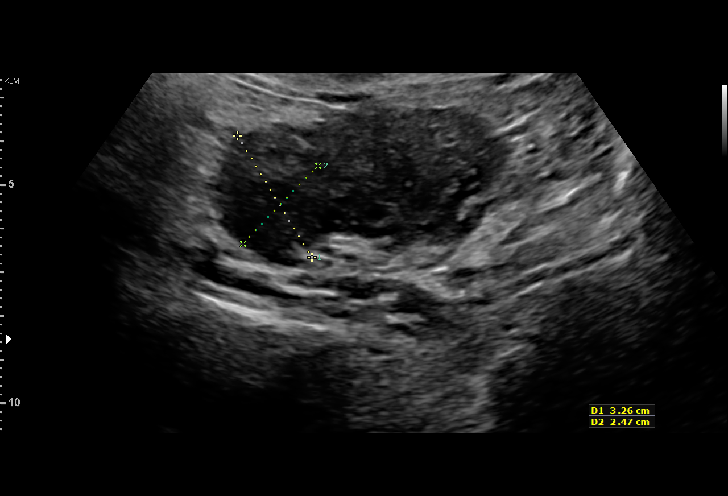
[im 17/21]
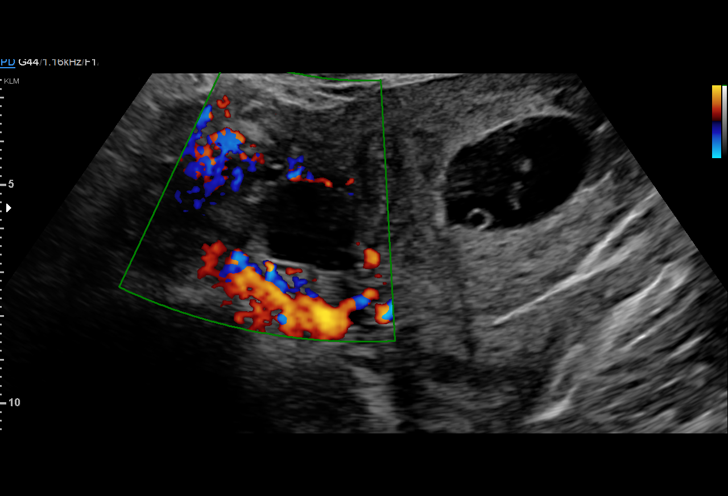
[im 18/21]
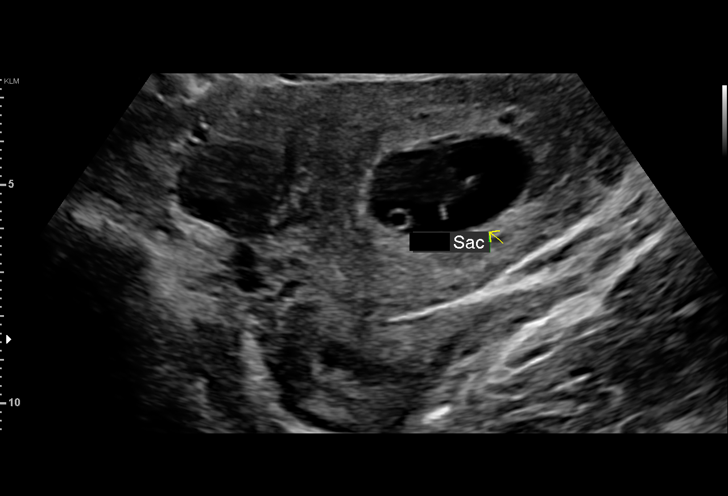
[im 19/21]
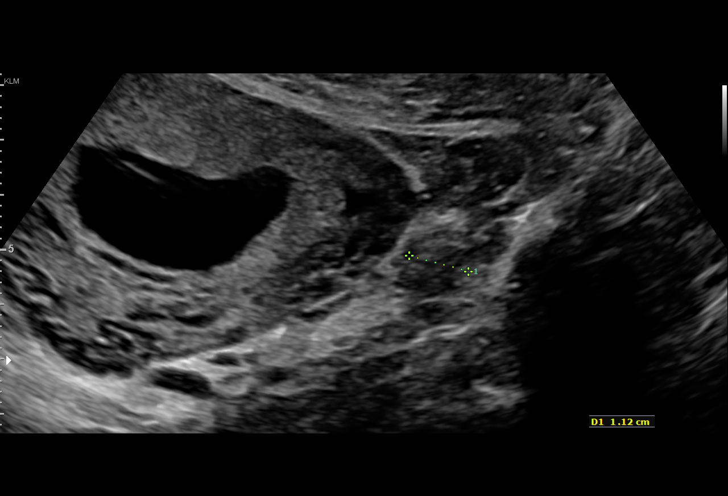
[im 21/21]
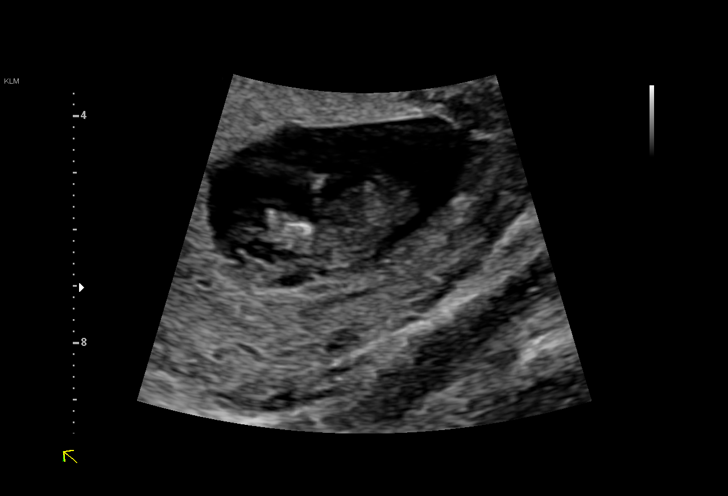

[15 of 21 positions shown; findings below may reference images not displayed]

FINDINGS: Intrauterine gestational sac: Single

Yolk sac:  Visualized.

Embryo:  Visualized.

Cardiac Activity: Visualized.

Heart Rate: 171 bpm

CRL:   38.4  mm   10 w 5 d                  US EDC: 04/10/2017

Subchorionic hemorrhage:  None visualized.

Maternal uterus/adnexae: Corpus luteum on the right. Normal left
ovary. No free fluid.
IMPRESSION: 1. No perigestational hematoma identified.
2. Viable 10 week 5 day intrauterine gestation.

## 2018-05-20 IMAGING — US US MFM OB DETAIL+14 WK
1 series · 14 of 28 positions shown · non-contrast
Comparison: none

[Series 1: us mfm ob detail+14 wk · 14 of 80 slices shown]
[im 3/80]
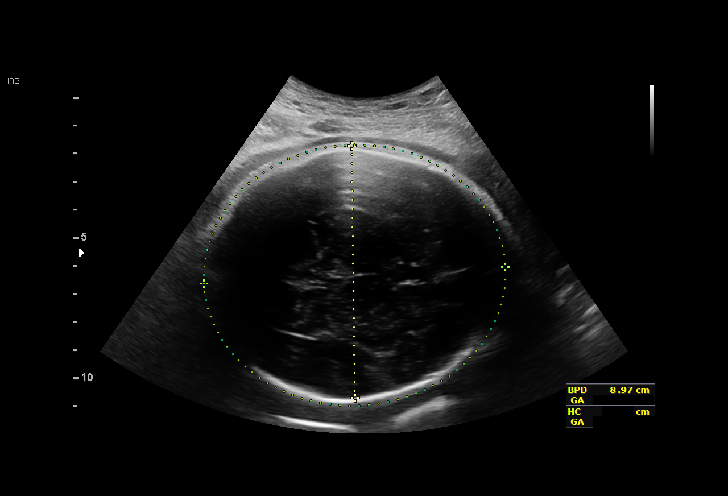
[im 9/80]
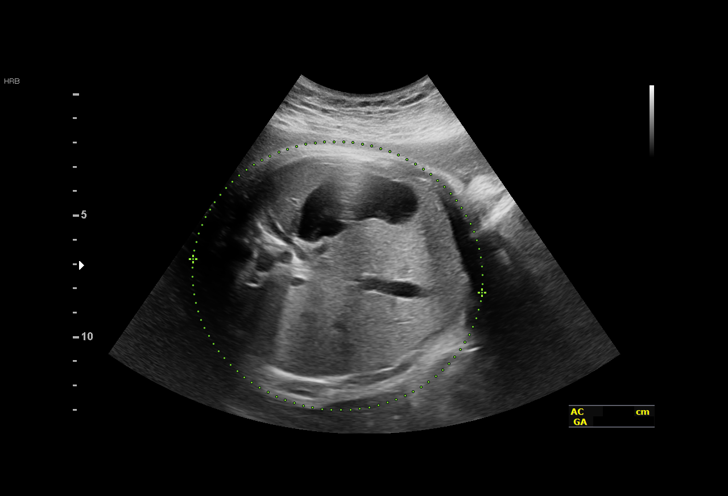
[im 15/80]
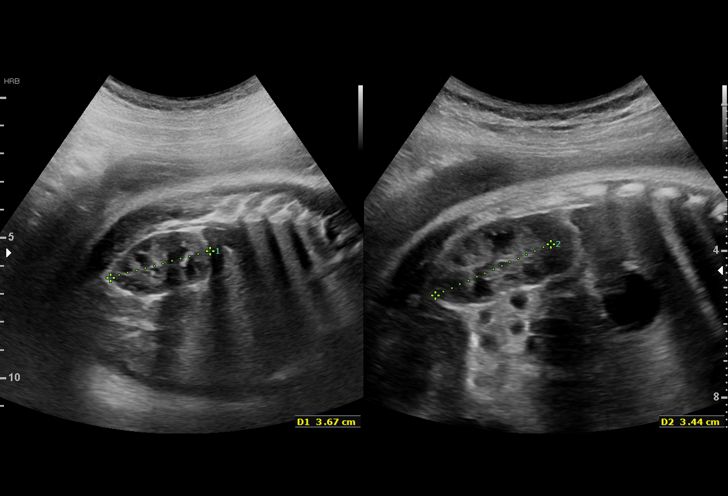
[im 21/80]
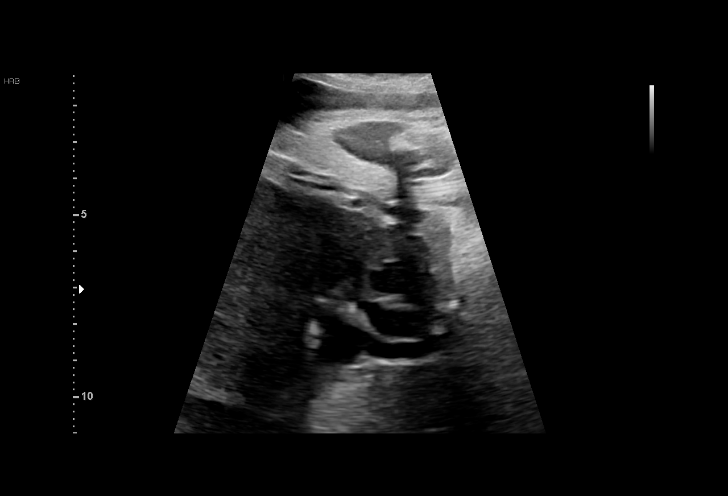
[im 27/80]
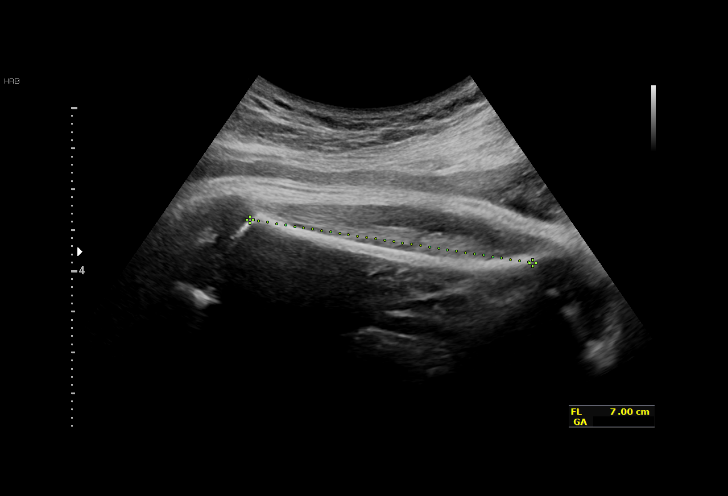
[im 33/80]
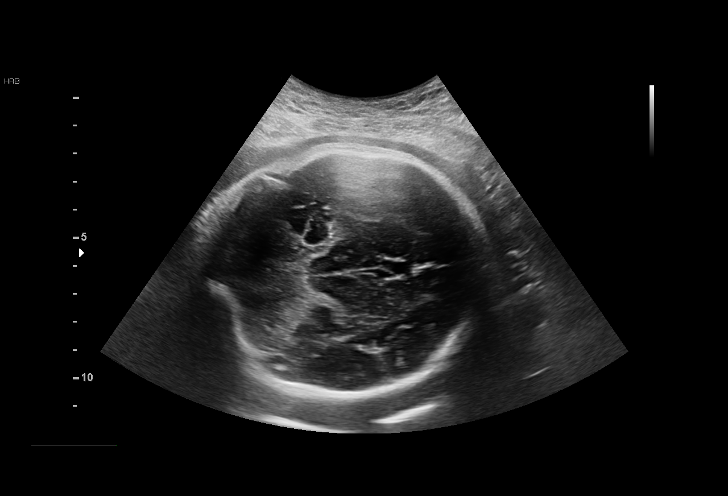
[im 39/80]
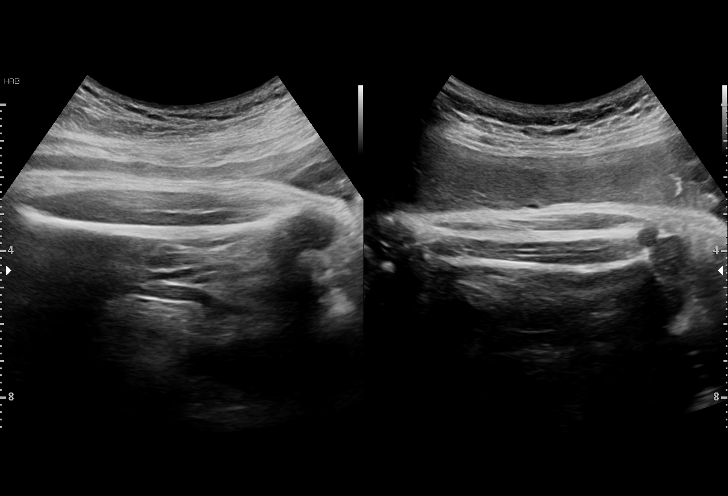
[im 44/80]
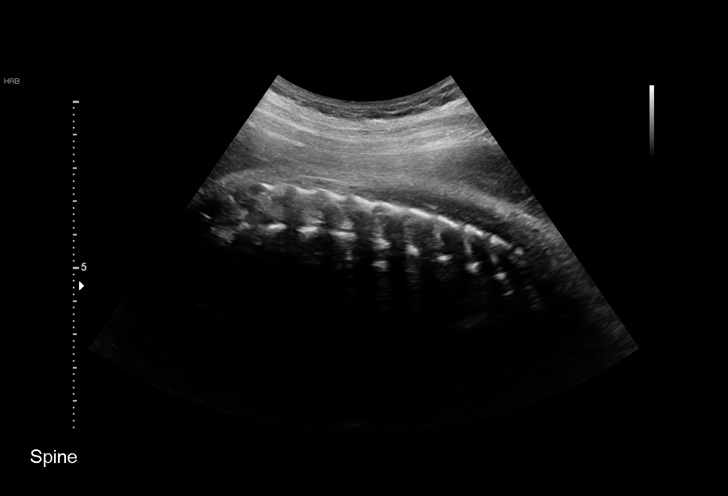
[im 50/80]
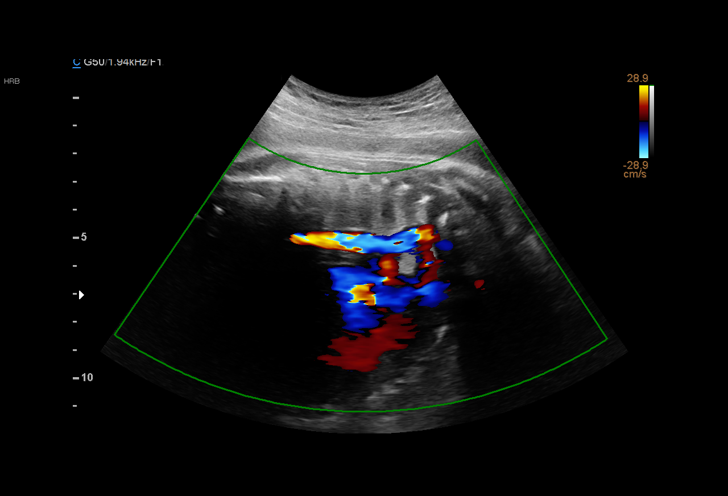
[im 56/80]
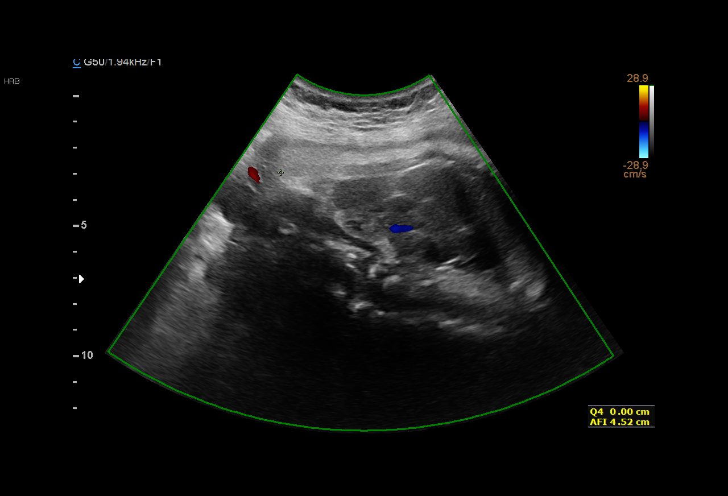
[im 62/80]
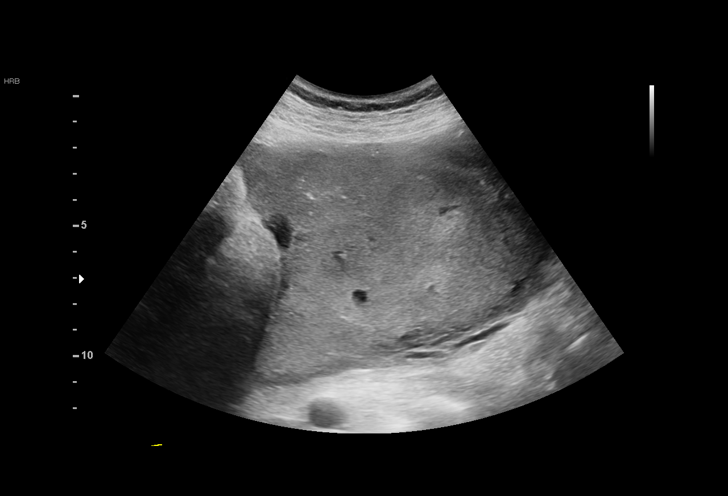
[im 68/80]
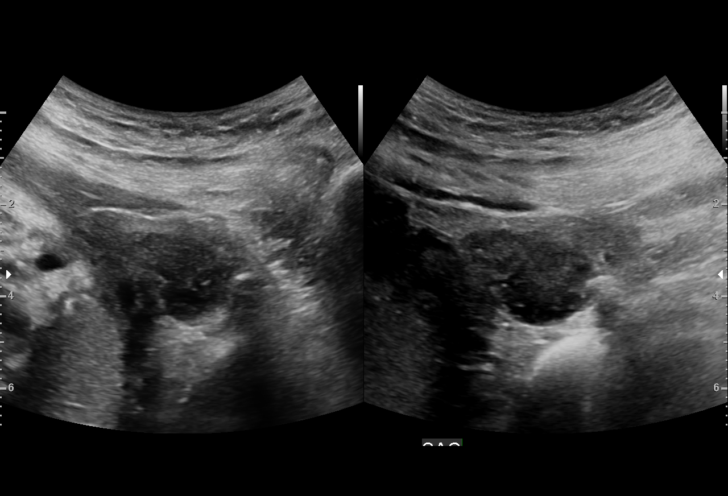
[im 74/80]
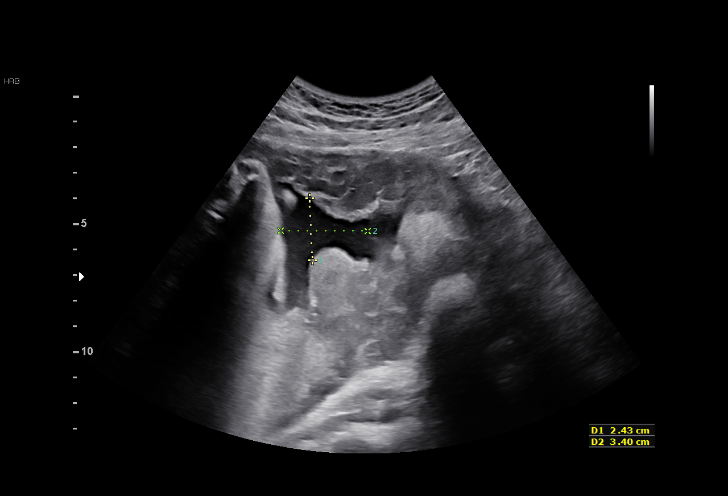
[im 80/80]
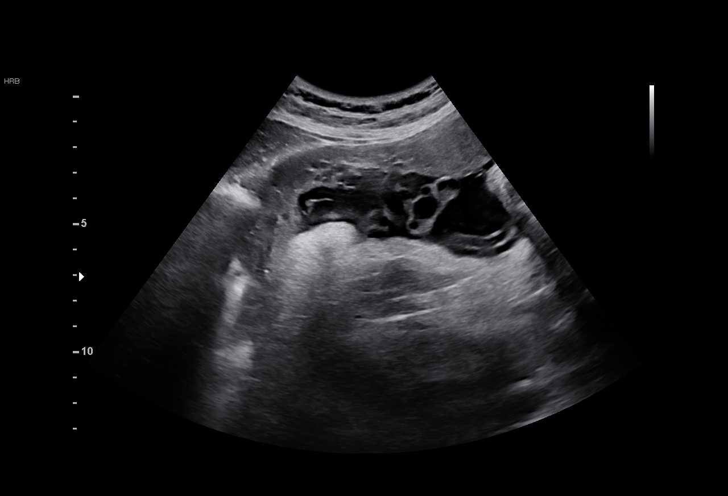

[14 of 28 positions shown; findings below may reference images not displayed]

JUMPER

1  SHULTS                 [id number]      [id number]     [id number]
SAMAYOA
Indications

38 weeks gestation of pregnancy
Size-Date Discrepancy
Encounter for fetal anatomic survey
OB History

Blood Type:            Height:  5'1"   Weight (lb):  150       BMI:
Gravidity:    3         Term:   1         SAB:   1
Living:       1
Fetal Evaluation

Num Of Fetuses:     1
Fetal Heart         151
Rate(bpm):
Cardiac Activity:   Observed
Presentation:       Cephalic
Placenta:           Left lateral
P. Cord Insertion:  Visualized, central

Amniotic Fluid
AFI FV:      Subjectively low-normal

AFI Sum(cm)     %Tile       Largest Pocket(cm)
6.33            < 3

RUQ(cm)       RLQ(cm)       LUQ(cm)        LLQ(cm)
2.42          0
Biometry
BPD:      89.6  mm     G. Age:  36w 2d         16  %    CI:        80.05   %    70 - 86
FL/HC:      22.1   %    20.6 -
HC:      316.4  mm     G. Age:  35w 4d        < 3  %    HC/AC:      0.89        0.87 -
AC:      356.8  mm     G. Age:  39w 4d         88  %    FL/BPD:     78.1   %    71 - 87
FL:         70  mm     G. Age:  35w 6d          5  %    FL/AC:      19.6   %    20 - 24
HUM:      60.6  mm     G. Age:  35w 1d          7  %

Est. FW:    1112  gm      7 lb 6 oz     68  %
Gestational Age

LMP:           38w 5d        Date:  07/07/16                 EDD:   04/13/17
U/S Today:     36w 6d                                        EDD:   04/26/17
Best:          38w 5d     Det. By:  LMP  (07/07/16)          EDD:   04/13/17
Anatomy

Cranium:               Appears normal         Aortic Arch:            Appears normal
Cavum:                 Appears normal         Ductal Arch:            Not well visualized
Ventricles:            Appears normal         Diaphragm:              Appears normal
Choroid Plexus:        Appears normal         Stomach:                Appears normal, left
sided
Cerebellum:            Appears normal         Abdomen:                Appears normal
Posterior Fossa:       Not well visualized    Abdominal Wall:         Not well visualized
Nuchal Fold:           Not applicable (>20    Cord Vessels:           Appears normal (3
wks GA)                                        vessel cord)
Face:                  Not well visualized    Kidneys:                Appear normal
Lips:                  Not well visualized    Bladder:                Appears normal
Thoracic:              Appears normal         Spine:                  Not well visualized
Heart:                 Appears normal         Upper Extremities:      Lt. Visualized
(4CH, axis, and situs
RVOT:                  Appears normal         Lower Extremities:      Visualized
LVOT:                  Appears normal

Other:  Technically difficult due to advanced gestational age.
Cervix Uterus Adnexa

Cervix
Not visualized (advanced GA >90wks)

Uterus
No abnormality visualized.

Left Ovary
Size(cm)        3   x    2     x  1.7       Vol(ml):
Within normal limits.

Right Ovary
Size(cm)       2.9  x   1.7    x  2.2       Vol(ml):
Within normal limits.

Cul De Sac:   No free fluid seen.

Adnexa:       No abnormality visualized.
Impression

SIUP at 38+5 weeks
Cephalic presentation
Normal detailed fetal anatomy; limited views of PF, face, CI
and spine
Low normal amniotic fluid volume
Measurements consistent with LMP dating; EFW at the 68th
%tile
Recommendations

Follow-up ultrasound in 1 week to reassess AFV

## 2018-11-11 ENCOUNTER — Encounter (HOSPITAL_COMMUNITY): Payer: Self-pay | Admitting: *Deleted

## 2018-11-11 ENCOUNTER — Other Ambulatory Visit: Payer: Self-pay

## 2018-11-11 ENCOUNTER — Inpatient Hospital Stay (HOSPITAL_COMMUNITY)
Admission: EM | Admit: 2018-11-11 | Discharge: 2018-11-11 | Disposition: A | Discharge status: Other Acute Inpatient Hospital | Payer: Self-pay | Attending: Emergency Medicine | Admitting: Emergency Medicine

## 2018-11-11 ENCOUNTER — Inpatient Hospital Stay (HOSPITAL_COMMUNITY): Payer: Self-pay

## 2018-11-11 DIAGNOSIS — R102 Pelvic and perineal pain: Secondary | ICD-10-CM | POA: Insufficient documentation

## 2018-11-11 DIAGNOSIS — Z3490 Encounter for supervision of normal pregnancy, unspecified, unspecified trimester: Secondary | ICD-10-CM

## 2018-11-11 DIAGNOSIS — O4690 Antepartum hemorrhage, unspecified, unspecified trimester: Secondary | ICD-10-CM

## 2018-11-11 DIAGNOSIS — Z6790 Unspecified blood type, Rh positive: Secondary | ICD-10-CM

## 2018-11-11 DIAGNOSIS — O219 Vomiting of pregnancy, unspecified: Secondary | ICD-10-CM

## 2018-11-11 DIAGNOSIS — O418X10 Other specified disorders of amniotic fluid and membranes, first trimester, not applicable or unspecified: Secondary | ICD-10-CM

## 2018-11-11 DIAGNOSIS — O4691 Antepartum hemorrhage, unspecified, first trimester: Secondary | ICD-10-CM

## 2018-11-11 DIAGNOSIS — O468X1 Other antepartum hemorrhage, first trimester: Secondary | ICD-10-CM | POA: Insufficient documentation

## 2018-11-11 DIAGNOSIS — Z3A01 Less than 8 weeks gestation of pregnancy: Secondary | ICD-10-CM | POA: Insufficient documentation

## 2018-11-11 LAB — I-STAT BETA HCG BLOOD, ED (MC, WL, AP ONLY): I-stat hCG, quantitative: >2000 m[IU]/mL — ABNORMAL HIGH (ref <5)

## 2018-11-11 LAB — CBC
HCT: 38.9 % (ref 36.0–46.0)
Hemoglobin: 13.8 g/dL (ref 12.0–15.0)
MCH: 30.5 pg (ref 26.0–34.0)
MCHC: 35.5 g/dL (ref 30.0–36.0)
MCV: 86.1 fL (ref 80.0–100.0)
Platelets: 270 10*3/uL (ref 150–400)
RBC: 4.52 MIL/uL (ref 3.87–5.11)
RDW: 13.2 % (ref 11.5–15.5)
WBC: 10 10*3/uL (ref 4.0–10.5)
nRBC: 0 % (ref 0.0–0.2)

## 2018-11-11 LAB — WET PREP, GENITAL
Clue Cells Wet Prep HPF POC: NONE SEEN
Sperm: NONE SEEN
Trich, Wet Prep: NONE SEEN
Yeast Wet Prep HPF POC: NONE SEEN

## 2018-11-11 LAB — HCG, QUANTITATIVE, PREGNANCY: hCG, Beta Chain, Quant, S: 23023 m[IU]/mL — ABNORMAL HIGH (ref <5)

## 2018-11-11 MED ORDER — DOXYLAMINE-PYRIDOXINE 10-10 MG PO TBEC: 2.0000 | DELAYED_RELEASE_TABLET | Freq: Every day | ORAL | 1 refills | Status: AC

## 2018-11-11 NOTE — MAU Provider Note (Signed)
History     CSN: [id number]  Arrival date and time: 11/11/18 1236   First Provider Initiated Contact with Patient 11/11/18 1742      Chief Complaint  Patient presents with  . Vaginal Bleeding   [name] is a 28 y.o. [phone] at [redacted]w[redacted]d who presents to MAU for vaginal bleeding which began yesterday. Pt reports yesterday she was passing very large clots, but reports today bleeding is just a little spotting. Pt also reports nausea with single episode of vomiting this morning.  Passing blood clots? yes, pt describes as "big" Blood soaking clothes? no Lightheaded/dizzy? no Significant pelvic pain or cramping? no Passed any tissue? no Current pregnancy problems? Pt has not yet been seen Blood Type? O Positive Allergies? NKDA Current medications? none Current PNC & next appt? [facility], Thursday 11/13/2018  Pt denies vaginal discharge/odor/itching. Pt denies abdominal pain, constipation, diarrhea, or urinary problems. Pt denies fever, chills, fatigue, sweating or changes in appetite. Pt denies SOB or chest pain. Pt denies dizziness, HA, light-headedness, weakness.   OB History    Gravida  4   Para  2   Term  2   Preterm  0   AB  1   Living  2     SAB  1   TAB  0   Ectopic  0   Multiple  0   Live Births  2           Past Medical History:  Diagnosis Date  . Medical history non-contributory   . No pertinent past medical history     Past Surgical History:  Procedure Laterality Date  . NO PAST SURGERIES      Family History  Problem Relation Age of Onset  . Early death Father     Social History   Tobacco Use  . Smoking status: Never Smoker  . Smokeless tobacco: Never Used  Substance Use Topics  . Alcohol use: No  . Drug use: No    Allergies: No Known Allergies  Medications Prior to Admission  Medication Sig Dispense Refill Last Dose  . desogestrel-ethinyl estradiol (APRI,EMOQUETTE,SOLIA) 0.15-30 MG-MCG tablet Take 1 tablet by  mouth daily. 1 Package 11   . ibuprofen (ADVIL,MOTRIN) 600 MG tablet Take 1 tablet (600 mg total) by mouth every 6 (six) hours. 30 tablet 0   . meloxicam (MOBIC) 15 MG tablet Take 1 tablet (15 mg total) by mouth daily. 30 tablet 0   . Prenatal MV-Min-Fe Fum-FA-DHA ([employer] WOMENS PRENATAL+DHA) 28-0.975 & 200 MG MISC Take 1 tablet by mouth daily. 60 each 0     Review of Systems  Constitutional: Negative for chills, diaphoresis, fatigue and fever.  Eyes: Negative for visual disturbance.  Respiratory: Negative for shortness of breath.   Cardiovascular: Negative for chest pain.  Gastrointestinal: Positive for nausea and vomiting. Negative for abdominal pain, constipation and diarrhea.  Genitourinary: Positive for vaginal bleeding. Negative for dysuria, flank pain, frequency, pelvic pain, urgency and vaginal discharge.  Neurological: Negative for dizziness, weakness, light-headedness and headaches.   Physical Exam   Blood pressure 107/79, pulse 63, temperature 98 F (36.7 C), resp. rate 16, height 5\' 1"  (1.549 m), weight 63.5 kg, last menstrual period 09/28/2018, SpO2 100 %, not currently breastfeeding.  Patient Vitals for the past 24 hrs:  BP Temp Pulse Resp SpO2 Height Weight  11/11/18 1735 107/79 98 F (36.7 C) 63 16 100 % - -  11/11/18 1652 - - 65 - 100 % - -  11/11/18 1650  105/74 - - - - - -  11/11/18 1301 - - - - - 5\' 1"  (1.549 m) 63.5 kg   Physical Exam  Constitutional: She is oriented to person, place, and time. She appears well-developed and well-nourished. No distress.  HENT:  Head: Normocephalic and atraumatic.  Respiratory: Effort normal.  GI: Soft. She exhibits no distension and no mass. There is no abdominal tenderness. There is no rebound and no guarding.  Genitourinary: There is no rash, tenderness or lesion on the right labia. There is no rash, tenderness or lesion on the left labia. Uterus is not enlarged and not tender. Cervix exhibits no motion tenderness, no discharge  and no friability. Right adnexum displays no mass, no tenderness and no fullness. Left adnexum displays no mass, no tenderness and no fullness.    Vaginal bleeding (small amount of blood present in vaginal vault, able to be cleared away with single Fox swab, small clot present at external os,  unable to be removed with swab, no active bleeding noted) present.     No vaginal discharge or tenderness.  There is bleeding (small amount of blood present in vaginal vault, able to be cleared away with single Fox swab, small clot present at external os,  unable to be removed with swab, no active bleeding noted) in the vagina. No tenderness in the vagina.  Neurological: She is alert and oriented to person, place, and time.  Skin: Skin is warm and dry. She is not diaphoretic.  Psychiatric: She has a normal mood and affect. Her behavior is normal. Judgment and thought content normal.   Results for orders placed or performed during the hospital encounter of 11/11/18 (from the past 24 hour(s))  I-Stat beta hCG blood, ED     Status: Abnormal   Collection Time: 11/11/18  1:33 PM  Result Value Ref Range   I-stat hCG, quantitative >2,000.0 (H) <5 mIU/mL   Comment 3          Wet prep, genital     Status: Abnormal   Collection Time: 11/11/18  5:43 PM  Result Value Ref Range   Yeast Wet Prep HPF POC NONE SEEN NONE SEEN   Trich, Wet Prep NONE SEEN NONE SEEN   Clue Cells Wet Prep HPF POC NONE SEEN NONE SEEN   WBC, Wet Prep HPF POC MANY (A) NONE SEEN   Sperm NONE SEEN   CBC     Status: None   Collection Time: 11/11/18  5:48 PM  Result Value Ref Range   WBC 10.0 4.0 - 10.5 K/uL   RBC 4.52 3.87 - 5.11 MIL/uL   Hemoglobin 13.8 12.0 - 15.0 g/dL   HCT 11/13/18 49.7 - 02.6 %   MCV 86.1 80.0 - 100.0 fL   MCH 30.5 26.0 - 34.0 pg   MCHC 35.5 30.0 - 36.0 g/dL   RDW 37.8 58.8 - 50.2 %   Platelets 270 150 - 400 K/uL   nRBC 0.0 0.0 - 0.2 %  hCG, quantitative, pregnancy     Status: Abnormal   Collection Time: 11/11/18   5:48 PM  Result Value Ref Range   hCG, Beta Chain, Quant, S 23,023 (H) <5 mIU/mL   11/13/18 Ob Less Than 14 Weeks With Ob Transvaginal  Result Date: 11/11/2018 CLINICAL DATA:  28 year old pregnant female with vaginal bleeding. LMP: 09/01/2018 corresponding to an estimated gestational age of [redacted] weeks, 2 days. EXAM: OBSTETRIC <14 WK 11/01/2018 AND TRANSVAGINAL OB US TECHNIQUE: Both transabdominal and transvaginal ultrasound examinations were  performed for complete evaluation of the gestation as well as the maternal uterus, adnexal regions, and pelvic cul-de-sac. Transvaginal technique was performed to assess early pregnancy. COMPARISON:  None. FINDINGS: Intrauterine gestational sac: Single intrauterine gestational sac. Yolk sac:  Seen Embryo:  Present Cardiac Activity: Detected Heart Rate: 104 bpm CRL:  3 mm   5 w   6 d                  US EDC: 07/08/2019 Subchorionic hemorrhage: Small subchorionic hemorrhage measuring approximately 2 x 15 mm. Maternal uterus/adnexae: The maternal ovaries are unremarkable. There is a corpus luteum in the left ovary. Small amount of heterogeneous content within the lower uterine and cervical canal noted which may represent blood product. Clinical correlation and follow-up as clinically indicated recommended. IMPRESSION: 1. Single live intrauterine pregnancy with an estimated gestational age of [redacted] weeks, 6 days. 2. Small subchorionic hemorrhage. 3. Probable blood product within the cervical canal. Clinical correlation and follow-up as clinically indicated. Electronically Signed   By: Elgie CollardArash  Radparvar M.D.   On: 11/11/2018 19:18    MAU Course  Procedures  MDM -r/o ectopic -CBC: WNL -US: single IUP, 7670w6d with yolk sac and fetal pole with FHB 104, heterogenous material in cervical canal, small subchorionic hemorrhage -hCG: 23, 023 -ABO: O Positive -WetPrep: many WBCs, otherwise WNL -GC/CT collected -pt discharged to home in stable condition  Orders Placed This Encounter  Procedures   . Wet prep, genital    Standing Status:   Standing    Number of Occurrences:   1  . US OB LESS THAN 14 WEEKS WITH OB TRANSVAGINAL    Standing Status:   Standing    Number of Occurrences:   1    Order Specific Question:   Symptom/Reason for Exam    Answer:   Vaginal bleeding in pregnancy [[id number]]  . CBC    Standing Status:   Standing    Number of Occurrences:   1  . hCG, quantitative, pregnancy    Standing Status:   Standing    Number of Occurrences:   1  . Diet NPO time specified    Standing Status:   Standing    Number of Occurrences:   1  . Pelvic cart to bedside    Standing Status:   Standing    Number of Occurrences:   1  . I-Stat beta hCG blood, ED    Standing Status:   Standing    Number of Occurrences:   1  . Discharge patient    Order Specific Question:   Discharge disposition    Answer:   01-Home or Self Care [1]    Order Specific Question:   Discharge patient date    Answer:   11/11/2018   Meds ordered this encounter  Medications  . Doxylamine-Pyridoxine (DICLEGIS) 10-10 MG TBEC    Sig: Take 2 tablets by mouth at bedtime.    Dispense:  60 tablet    Refill:  1    Order Specific Question:   Supervising Provider    Answer:   ERVIN, MICHAEL L [[id number]]   Assessment and Plan   1. Subchorionic hemorrhage of placenta in first trimester, single or unspecified fetus   2. Vaginal bleeding in pregnancy   3. Blood type, Rh positive   4. [redacted] weeks gestation of pregnancy   5. Intrauterine pregnancy   6. Nausea and vomiting in pregnancy    Allergies as of 11/11/2018   No Known Allergies  Medication List    STOP taking these medications   desogestrel-ethinyl estradiol 0.15-30 MG-MCG tablet Commonly known as: APRI   ibuprofen 600 MG tablet Commonly known as: ADVIL   meloxicam 15 MG tablet Commonly known as: MOBIC     TAKE these medications   [facility] W[facility]ens Prenatal+DHA 28-0.975 & 200 MG Misc Take 1 tablet by mouth daily.   Doxylamine-Pyridoxine 10-10 MG  Tbec Commonly known as: Diclegis Take 2 tablets by mouth at bedtime.      -will call with culture results, if positive -RX Diclegis for nausea and vomiting -safe meds in pregnancy list given -pt advised to start PNVs -pt advised to start [facility], list of area OBs given, but pt also has appt scheduled with [facility] on Thursday 11/13/2018 -strict VB/pain/return MAU precautions given -pt discharged to home in stable condition  Joni Reining E Jyasia Markoff 11/11/2018, 7:23 PM

## 2018-11-11 NOTE — Discharge Instructions (Signed)
Subchorionic Hematoma ° °A subchorionic hematoma is a gathering of blood between the outer wall of the embryo (chorion) and the inner wall of the womb (uterus). °This condition can cause vaginal bleeding. If they cause little or no vaginal bleeding, early small hematomas usually shrink on their own and do not affect your baby or pregnancy. When bleeding starts later in pregnancy, or if the hematoma is larger or occurs in older pregnant women, the condition may be more serious. Larger hematomas may get bigger, which increases the chances of miscarriage. This condition also increases the risk of: °· Premature separation of the placenta from the uterus. °· Premature (preterm) labor. °· Stillbirth. °What are the causes? °The exact cause of this condition is not known. It occurs when blood is trapped between the placenta and the uterine wall because the placenta has separated from the original site of implantation. °What increases the risk? °You are more likely to develop this condition if: °· You were treated with fertility medicines. °· You conceived through in vitro fertilization (IVF). °What are the signs or symptoms? °Symptoms of this condition include: °· Vaginal spotting or bleeding. °· Contractions of the uterus. These cause abdominal pain. °Sometimes you may have no symptoms and the bleeding may only be seen when ultrasound images are taken (transvaginal ultrasound). °How is this diagnosed? °This condition is diagnosed based on a physical exam. This includes a pelvic exam. You may also have other tests, including: °· Blood tests. °· Urine tests. °· Ultrasound of the abdomen. °How is this treated? °Treatment for this condition can vary. Treatment may include: °· Watchful waiting. You will be monitored closely for any changes in bleeding. During this stage: °? The hematoma may be reabsorbed by the body. °? The hematoma may separate the fluid-filled space containing the embryo (gestational sac) from the wall of the  womb (endometrium). °· Medicines. °· Activity restriction. This may be needed until the bleeding stops. °Follow these instructions at home: °· Stay on bed rest if told to do so by your health care provider. °· Do not lift anything that is heavier than 10 lbs. (4.5 kg) or as told by your health care provider. °· Do not use any products that contain nicotine or tobacco, such as cigarettes and e-cigarettes. If you need help quitting, ask your health care provider. °· Track and write down the number of pads you use each day and how soaked (saturated) they are. °· Do not use tampons. °· Keep all follow-up visits as told by your health care provider. This is important. Your health care provider may ask you to have follow-up blood tests or ultrasound tests or both. °Contact a health care provider if: °· You have any vaginal bleeding. °· You have a fever. °Get help right away if: °· You have severe cramps in your stomach, back, abdomen, or pelvis. °· You pass large clots or tissue. Save any tissue for your health care provider to look at. °· You have more vaginal bleeding, and you faint or become lightheaded or weak. °Summary °· A subchorionic hematoma is a gathering of blood between the outer wall of the placenta and the uterus. °· This condition can cause vaginal bleeding. °· Sometimes you may have no symptoms and the bleeding may only be seen when ultrasound images are taken. °· Treatment may include watchful waiting, medicines, or activity restriction. °This information is not intended to replace advice given to you by your health care provider. Make sure you discuss any questions you   have with your health care provider. Document Released: 05/02/2006 Document Revised: 12/28/2016 Document Reviewed: 03/13/2016 Elsevier Patient Education  2020 [employer] [facility] Providers     [facility]     Phone: [phone]  [facility] [place]  Phone:  [phone]  [facility] [place]  Phone: [phone]  [facility] [place]                           Phone: [phone]  [facility]          Phone: [phone]  [facility]    Phone: [phone]   [facility])    Phone: [phone]  [facility]    Phone: [phone]  [facility]    Phone: [phone]  [facility]    Phone: [phone]  [facility]  Phone: [phone]  [facility]               Phone: [phone]  [facility]   Phone: [phone]  [facility]    Phone: [phone]                                  Safe Medications in Pregnancy    Acne: Benzoyl Peroxide Salicylic Acid  Backache/Headache: Tylenol: 2 regular strength every 4 hours OR              2 Extra strength every 6 hours  Colds/Coughs/Allergies: Benadryl (alcohol free) 25 mg every 6 hours as needed Breath right strips Claritin Cepacol throat lozenges Chloraseptic throat spray Cold-Eeze- up to three times per day Cough drops, alcohol free Flonase (by prescription only) Guaifenesin Mucinex Robitussin DM (plain only, alcohol free) Saline nasal spray/drops Sudafed (pseudoephedrine) & Actifed ** use only after [redacted] weeks gestation and if you do not have high blood pressure Tylenol Vicks Vaporub Zinc lozenges Zyrtec   Constipation: Colace Ducolax suppositories Fleet enema Glycerin suppositories Metamucil Milk of magnesia Miralax Senokot Smooth move tea  Diarrhea: Kaopectate Imodium A-D  *NO pepto Bismol  Hemorrhoids: Anusol Anusol HC Preparation H Tucks  Indigestion: Tums Maalox Mylanta Zantac  Pepcid  Insomnia: Benadryl (alcohol free) 25mg  every 6 hours as  needed Tylenol PM Unisom, no Gelcaps  Leg Cramps: Tums MagGel  Nausea/Vomiting:  Bonine Dramamine Emetrol Ginger extract Sea bands Meclizine  Nausea medication to take during pregnancy:  Unisom (doxylamine succinate 25 mg tablets) Take one tablet daily at bedtime. If symptoms are not adequately controlled, the dose can be increased to a maximum recommended dose of two tablets daily (1/2 tablet in the morning, 1/2 tablet mid-afternoon and one at bedtime). Vitamin B6 100mg  tablets. Take one tablet twice a day (up to 200 mg per day).  Skin Rashes: Aveeno products Benadryl cream or 25mg  every 6 hours as needed Calamine Lotion 1% cortisone cream  Yeast infection: Gyne-lotrimin 7 Monistat 7   **If taking multiple medications, please check labels to avoid duplicating the same active ingredients **take medication as directed on the label ** Do not exceed 4000 mg of tylenol in 24 hours **Do not take medications that contain aspirin or ibuprofen     Hyperemesis Gravidarum Hyperemesis gravidarum is a severe form of nausea and vomiting that happens during pregnancy. Hyperemesis is  worse than morning sickness. It may cause you to have nausea or vomiting all day for many days. It may keep you from eating and drinking enough food and liquids, which can lead to dehydration, malnutrition, and weight loss. Hyperemesis usually occurs during the first half (the first 20 weeks) of pregnancy. It often goes away once a woman is in her second half of pregnancy. However, sometimes hyperemesis continues through an entire pregnancy. What are the causes? The cause of this condition is not known. It may be related to changes in chemicals (hormones) in the body during pregnancy, such as the high level of pregnancy hormone (human chorionic gonadotropin) or the increase in the female sex hormone (estrogen). What are the signs or symptoms? Symptoms of this condition include:  Nausea that does not go  away.  Vomiting that does not allow you to keep any food down.  Weight loss.  Body fluid loss (dehydration).  Having no desire to eat, or not liking food that you have previously enjoyed. How is this diagnosed? This condition may be diagnosed based on:  A physical exam.  Your medical history.  Your symptoms.  Blood tests.  Urine tests. How is this treated? This condition is managed by controlling symptoms. This may include:  Following an eating plan. This can help lessen nausea and vomiting.  Taking prescription medicines. An eating plan and medicines are often used together to help control symptoms. If medicines do not help relieve nausea and vomiting, you may need to receive fluids through an IV at the hospital. Follow these instructions at home: Eating and drinking   Avoid the following: ? Drinking fluids with meals. Try not to drink anything during the 30 minutes before and after your meals. ? Drinking more than 1 cup of fluid at a time. ? Eating foods that trigger your symptoms. These may include spicy foods, coffee, high-fat foods, very sweet foods, and acidic foods. ? Skipping meals. Nausea can be more intense on an empty stomach. If you cannot tolerate food, do not force it. Try sucking on ice chips or other frozen items and make up for missed calories later. ? Lying down within 2 hours after eating. ? Being exposed to environmental triggers. These may include food smells, smoky rooms, closed spaces, rooms with strong smells, warm or humid places, overly loud and noisy rooms, and rooms with motion or flickering lights. Try eating meals in a well-ventilated area that is free of strong smells. ? Quick and sudden changes in your movement. ? Taking iron pills and multivitamins that contain iron. If you take prescription iron pills, do not stop taking them unless your health care provider approves. ? Preparing food. The smell of food can spoil your appetite or trigger  nausea.  To help relieve your symptoms: ? Listen to your body. Everyone is different and has different preferences. Find what works best for you. ? Eat and drink slowly. ? Eat 5-6 small meals daily instead of 3 large meals. Eating small meals and snacks can help you avoid an empty stomach. ? In the morning, before getting out of bed, eat a couple of crackers to avoid moving around on an empty stomach. ? Try eating starchy foods as these are usually tolerated well. Examples include cereal, toast, bread, potatoes, pasta, rice, and pretzels. ? Include at least 1 serving of protein with your meals and snacks. Protein options include lean meats, poultry, seafood, beans, nuts, nut butters, eggs, cheese, and yogurt. ? Try eating a protein-rich  snack before bed. Examples of a protein-rick snack include cheese and crackers or a peanut butter sandwich made with 1 slice of whole-wheat bread and 1 tsp (5 g) of peanut butter. ? Eat or suck on things that have ginger in them. It may help relieve nausea. Add  tsp ground ginger to hot tea or choose ginger tea. ? Try drinking 100% fruit juice or an electrolyte drink. An electrolyte drink contains sodium, potassium, and chloride. ? Drink fluids that are cold, clear, and carbonated or sour. Examples include lemonade, ginger ale, lemon-lime soda, ice water, and sparkling water. ? Brush your teeth or use a mouth rinse after meals. ? Talk with your health care provider about starting a supplement of vitamin B6. General instructions  Take over-the-counter and prescription medicines only as told by your health care provider.  Follow instructions from your health care provider about eating or drinking restrictions.  Continue to take your prenatal vitamins as told by your health care provider. If you are having trouble taking your prenatal vitamins, talk with your health care provider about different options.  Keep all follow-up and pre-birth (prenatal) visits as told  by your health care provider. This is important. Contact a health care provider if:  You have pain in your abdomen.  You have a severe headache.  You have vision problems.  You are losing weight.  You feel weak or dizzy. Get help right away if:  You cannot drink fluids without vomiting.  You vomit blood.  You have constant nausea and vomiting.  You are very weak.  You faint.  You have a fever and your symptoms suddenly get worse. Summary  Hyperemesis gravidarum is a severe form of nausea and vomiting that happens during pregnancy.  Making some changes to your eating habits may help relieve nausea and vomiting.  This condition may be managed with medicine.  If medicines do not help relieve nausea and vomiting, you may need to receive fluids through an IV at the hospital. This information is not intended to replace advice given to you by your health care provider. Make sure you discuss any questions you have with your health care provider. Document Released: 01/15/2005 Document Revised: 02/04/2017 Document Reviewed: 09/14/2015 Elsevier Patient Education  2020 Elsevier Inc.  Morning Sickness  Morning sickness is when a woman feels nauseous during pregnancy. This nauseous feeling may or may not come with vomiting. It often occurs in the morning, but it can be a problem at any time of day. Morning sickness is most common during the first trimester. In some cases, it may continue throughout pregnancy. Although morning sickness is unpleasant, it is usually harmless unless the woman develops severe and continual vomiting (hyperemesis gravidarum), a condition that requires more intense treatment. What are the causes? The exact cause of this condition is not known, but it seems to be related to normal hormonal changes that occur in pregnancy. What increases the risk? You are more likely to develop this condition if:  You experienced nausea or vomiting before your  pregnancy.  You had morning sickness during a previous pregnancy.  You are pregnant with more than one baby, such as twins. What are the signs or symptoms? Symptoms of this condition include:  Nausea.  Vomiting. How is this diagnosed? This condition is usually diagnosed based on your signs and symptoms. How is this treated? In many cases, treatment is not needed for this condition. Making some changes to what you eat may help to control symptoms. Your  health care provider may also prescribe or recommend:  Vitamin B6 supplements.  Anti-nausea medicines.  Ginger. Follow these instructions at home: Medicines  Take over-the-counter and prescription medicines only as told by your health care provider. Do not use any prescription, over-the-counter, or herbal medicines for morning sickness without first talking with your health care provider.  Taking multivitamins before getting pregnant can prevent or decrease the severity of morning sickness in most women. Eating and drinking  Eat a piece of dry toast or crackers before getting out of bed in the morning.  Eat 5 or 6 small meals a day.  Eat dry and bland foods, such as rice or a baked potato. Foods that are high in carbohydrates are often helpful.  Avoid greasy, fatty, and spicy foods.  Have someone cook for you if the smell of any food causes nausea and vomiting.  If you feel nauseous after taking prenatal vitamins, take the vitamins at night or with a snack.  Snack on protein foods between meals if you are hungry. Nuts, yogurt, and cheese are good options.  Drink fluids throughout the day.  Try ginger ale made with real ginger, ginger tea made from fresh grated ginger, or ginger candies. General instructions  Do not use any products that contain nicotine or tobacco, such as cigarettes and e-cigarettes. If you need help quitting, ask your health care provider.  Get an air purifier to keep the air in your house free of  odors.  Get plenty of fresh air.  Try to avoid odors that trigger your nausea.  Consider trying these methods to help relieve symptoms: ? Wearing an acupressure wristband. These wristbands are often worn for seasickness. ? Acupuncture. Contact a health care provider if:  Your home remedies are not working and you need medicine.  You feel dizzy or light-headed.  You are losing weight. Get help right away if:  You have persistent and uncontrolled nausea and vomiting.  You faint.  You have severe pain in your abdomen. Summary  Morning sickness is when a woman feels nauseous during pregnancy. This nauseous feeling may or may not come with vomiting.  Morning sickness is most common during the first trimester.  It often occurs in the morning, but it can be a problem at any time of day.  In many cases, treatment is not needed for this condition. Making some changes to what you eat may help to control symptoms. This information is not intended to replace advice given to you by your health care provider. Make sure you discuss any questions you have with your health care provider. Document Released: 03/08/2006 Document Revised: 12/28/2016 Document Reviewed: 02/18/2016 Elsevier Patient Education  2020 [employer].  Vaginal Bleeding During Pregnancy, First Trimester  A small amount of bleeding from the vagina (spotting) is relatively common during early pregnancy. It usually stops on its own. Various things may cause bleeding or spotting during early pregnancy. Some bleeding may be related to the pregnancy, and some may not. In many cases, the bleeding is normal and is not a problem. However, bleeding can also be a sign of something serious. Be sure to tell your health care provider about any vaginal bleeding right away. Some possible causes of vaginal bleeding during the first trimester include:  Infection or inflammation of the cervix.  Growths (polyps) on the  cervix.  Miscarriage or threatened miscarriage.  Pregnancy tissue developing outside of the uterus (ectopic pregnancy).  A mass of tissue developing in the uterus due to  an egg being fertilized incorrectly (molar pregnancy). Follow these instructions at home: Activity  Follow instructions from your health care provider about limiting your activity. Ask what activities are safe for you.  If needed, make plans for someone to help with your regular activities.  Do not have sex or orgasms until your health care provider says that this is safe. General instructions  Take over-the-counter and prescription medicines only as told by your health care provider.  Pay attention to any changes in your symptoms.  Do not use tampons or douche.  Write down how many pads you use each day, how often you change pads, and how soaked (saturated) they are.  If you pass any tissue from your vagina, save the tissue so you can show it to your health care provider.  Keep all follow-up visits as told by your health care provider. This is important. Contact a health care provider if:  You have vaginal bleeding during any part of your pregnancy.  You have cramps or labor pains.  You have a fever. Get help right away if:  You have severe cramps in your back or abdomen.  You pass large clots or a large amount of tissue from your vagina.  Your bleeding increases.  You feel light-headed or weak, or you faint.  You have chills.  You are leaking fluid or have a gush of fluid from your vagina. Summary  A small amount of bleeding (spotting) from the vagina is relatively common during early pregnancy.  Various things may cause bleeding or spotting in early pregnancy.  Be sure to tell your health care provider about any vaginal bleeding right away. This information is not intended to replace advice given to you by your health care provider. Make sure you discuss any questions you have with your  health care provider. Document Released: 10/25/2004 Document Revised: 05/06/2018 Document Reviewed: 04/19/2016 Elsevier Patient Education  2020 [employer].

## 2018-11-11 NOTE — ED Triage Notes (Signed)
Pt reports LMP approx 4 week ago. Had a positive home pregnancy test. Yesterday passed large blood clot. Today with spotting. Pt has not had any contact with OB to confirm IUP. Pt awake, alert, appropriate. NAD at present.

## 2018-11-11 NOTE — [facility] Note (Signed)
.   Amber Murphy is a 28 y.o. at [redacted]w[redacted]d here in [facility]U reporting: that she was sent here from the ED for vaginal bleeding. Pt denies any pain, Last intercourse last night LMP: 09/28/18 Onset of complaint: 5pm yesterday Pain score: 0 Vitals:   11/11/18 1652 11/11/18 1735  BP:  107/79  Pulse: 65 63  Resp:  16  Temp:  98 F (36.7 C)  SpO2: 100% 100%     FHT: Lab orders placed from triage: [facility]

## 2018-11-11 NOTE — ED Provider Notes (Signed)
[facility] EMERGENCY DEPARTMENT Provider Note   CSN: [id number] Arrival date & time: 11/11/18  1236    History   Chief Complaint Chief Complaint  Patient presents with  . Vaginal Bleeding    HPI Amber Murphy is a 28 y.o. female.     HPI    28 year old female presents today with vaginal bleeding.  She notes her last menstrual was on August 30.  She notes she had a sharp pain in her lower pelvic region last night followed by vaginal bleeding.  She notes she used 6 pads over the last 24 hours with clots noted.  She denies any pelvic pain today.   Past Medical History:  Diagnosis Date  . Medical history non-contributory   . No pertinent past medical history     There are no active problems to display for this patient.   Past Surgical History:  Procedure Laterality Date  . NO PAST SURGERIES       OB History    Gravida  3   Para  2   Term  2   Preterm  0   AB  1   Living  2     SAB  1   TAB  0   Ectopic  0   Multiple  0   Live Births  2            Home Medications    Prior to Admission medications   Medication Sig Start Date End Date Taking? Authorizing Provider  desogestrel-ethinyl estradiol (APRI,EMOQUETTE,SOLIA) 0.15-30 MG-MCG tablet Take 1 tablet by mouth daily. 06/12/17   Steve Rattler, DO  ibuprofen (ADVIL,MOTRIN) 600 MG tablet Take 1 tablet (600 mg total) by mouth every 6 (six) hours. 04/08/17   Rory Percy, DO  meloxicam (MOBIC) 15 MG tablet Take 1 tablet (15 mg total) by mouth daily. 06/12/17   Steve Rattler, DO  Prenatal MV-Min-Fe Fum-FA-DHA ([employer] WOMENS PRENATAL+DHA) 28-0.975 & 200 MG MISC Take 1 tablet by mouth daily. 08/10/16   Duffy Bruce, MD   Family History Family History  Problem Relation Age of Onset  . Early death Father     Social History Social History   Tobacco Use  . Smoking status: Never Smoker  . Smokeless tobacco: Never Used  Substance Use Topics  . Alcohol use: No  .  Drug use: No     Allergies   Patient has no known allergies.   Review of Systems Review of Systems  All other systems reviewed and are negative.    Physical Exam Updated Vital Signs BP 105/74   Pulse 65   Ht 5\' 1"  (1.549 m)   Wt 63.5 kg   LMP 09/28/2018   SpO2 100%   BMI 26.45 kg/m   Physical Exam Vitals signs and nursing note reviewed.  Constitutional:      Appearance: She is well-developed.  HENT:     Head: Normocephalic and atraumatic.  Eyes:     General: No scleral icterus.       Right eye: No discharge.        Left eye: No discharge.     Conjunctiva/sclera: Conjunctivae normal.     Pupils: Pupils are equal, round, and reactive to light.  Neck:     Musculoskeletal: Normal range of motion.     Vascular: No JVD.     Trachea: No tracheal deviation.  Pulmonary:     Effort: Pulmonary effort is normal.     Breath sounds: No  stridor.  Abdominal:     Comments: Abdomen soft nontender  Neurological:     Mental Status: She is alert and oriented to person, place, and time.     Coordination: Coordination normal.  Psychiatric:        Behavior: Behavior normal.        Thought Content: Thought content normal.        Judgment: Judgment normal.     ED Treatments / Results  Labs (all labs ordered are listed, but only abnormal results are displayed) Labs Reviewed  I-STAT BETA HCG BLOOD, ED ([facility], [facility], [facility] ONLY) - Abnormal; Notable for the following components:      Result Value   I-stat hCG, quantitative >2,000.0 (*)    All other components within normal limits    EKG None  Radiology No results found.  Procedures Procedures (including critical care time)  Medications Ordered in ED Medications - No data to display   Initial Impression / Assessment and Plan / ED Course  I have reviewed the triage vital signs and the nursing notes.  Pertinent labs & imaging results that were available during my care of the patient were reviewed by me and considered in my  medical decision making (see chart for details).         Assessment/Plan: 28 year old female presents today with vaginal bleeding early pregnancy.  She is approximately [redacted] weeks along at this point.  She has no severe pain, she appears stable on my exam, she has no signs of tachycardia or significant hemodynamic instability secondary to blood loss.  Patient appropriate for transfer to women's at this time.  Nursing staff talk to women's who agreed to take the patient at this time.  Final Clinical Impressions(s) / ED Diagnoses   Final diagnoses:  Vaginal bleeding in pregnancy    ED Discharge Orders    None       Rosalio Loud 11/11/18 1830    Terrilee Files, MD 11/12/18 1005

## 2018-11-17 LAB — GC/CHLAMYDIA PROBE AMP (~~LOC~~) NOT AT ARMC
Chlamydia: NEGATIVE
Comment: NEGATIVE
Comment: NORMAL
Neisseria Gonorrhea: NEGATIVE

## 2019-01-08 ENCOUNTER — Other Ambulatory Visit: Payer: Self-pay

## 2019-01-08 DIAGNOSIS — Z20822 Contact with and (suspected) exposure to covid-19: Secondary | ICD-10-CM

## 2019-01-10 LAB — NOVEL CORONAVIRUS, NAA: SARS-CoV-2, NAA: DETECTED — AB

## 2019-01-16 ENCOUNTER — Other Ambulatory Visit: Payer: Self-pay

## 2019-01-16 DIAGNOSIS — Z20822 Contact with and (suspected) exposure to covid-19: Secondary | ICD-10-CM

## 2019-01-17 LAB — NOVEL CORONAVIRUS, NAA: SARS-CoV-2, NAA: DETECTED — AB

## 2019-01-19 ENCOUNTER — Encounter: Payer: Self-pay | Admitting: Medical

## 2019-01-19 DIAGNOSIS — U071 COVID-19: Secondary | ICD-10-CM | POA: Insufficient documentation

## 2019-06-12 IMAGING — US US OB TRANSVAGINAL
1 series · 14 of 28 positions shown · non-contrast
Comparison: 05/15/2016

CLINICAL DATA: Bleeding since this morning.  Pelvic pain for 1 day

EXAM:
OBSTETRIC <14 WK US AND TRANSVAGINAL OB US
TECHNIQUE: Both transabdominal and transvaginal ultrasound examinations were
performed for complete evaluation of the gestation as well as the
maternal uterus, adnexal regions, and pelvic cul-de-sac.
Transvaginal technique was performed to assess early pregnancy.

[Series 1: us ob transvaginal · 0.25mm/px · 14 of 89 slices shown]
[im 4/89]
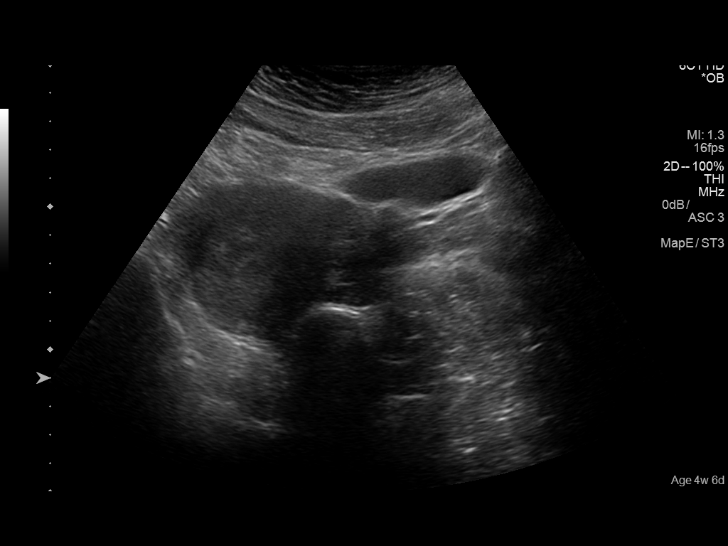
[im 10/89]
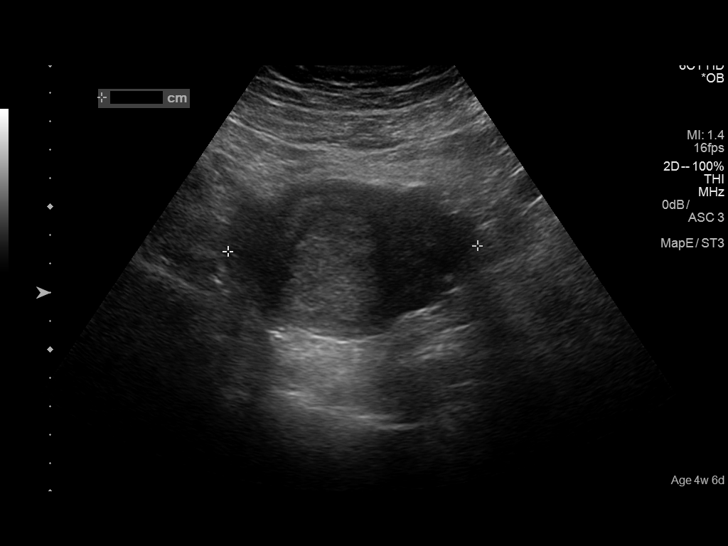
[im 17/89]
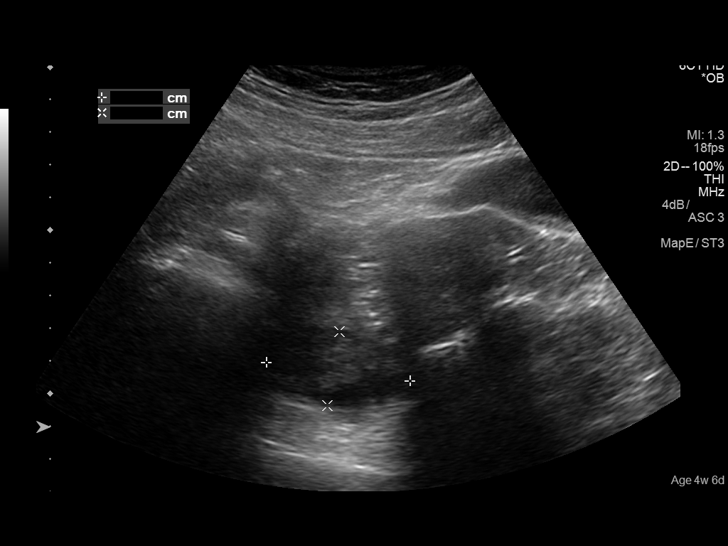
[im 23/89]
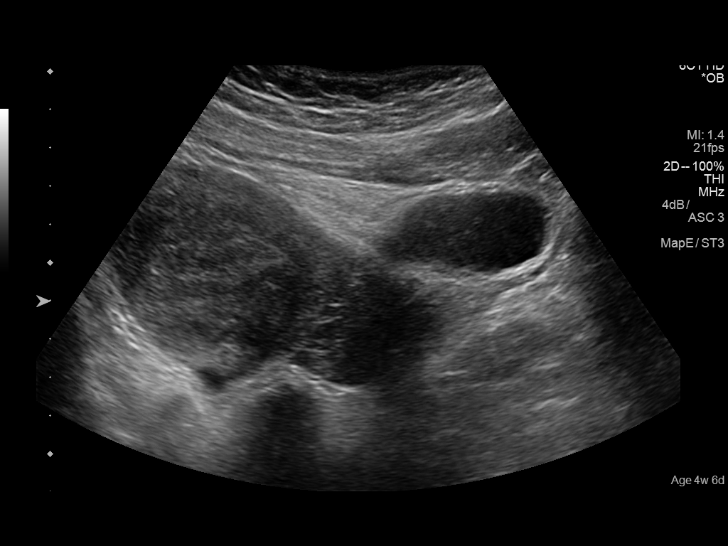
[im 30/89]
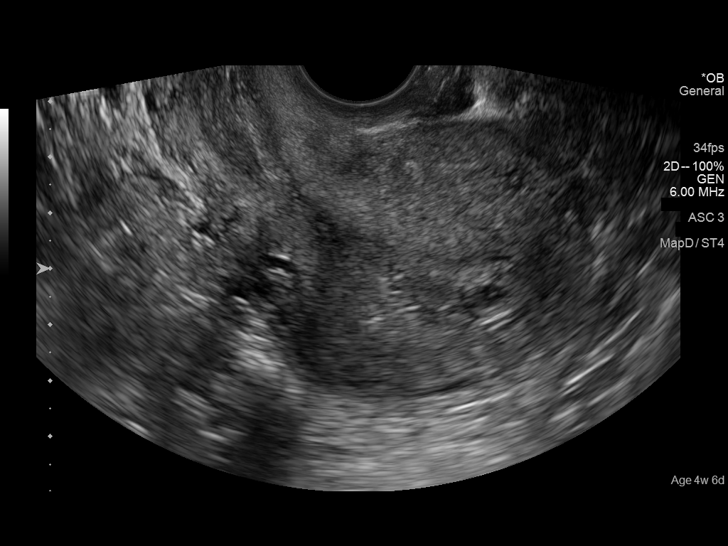
[im 36/89]
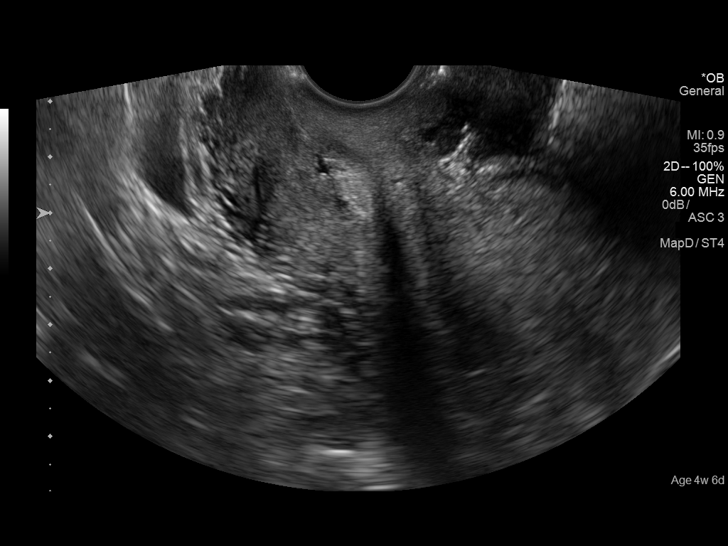
[im 43/89]
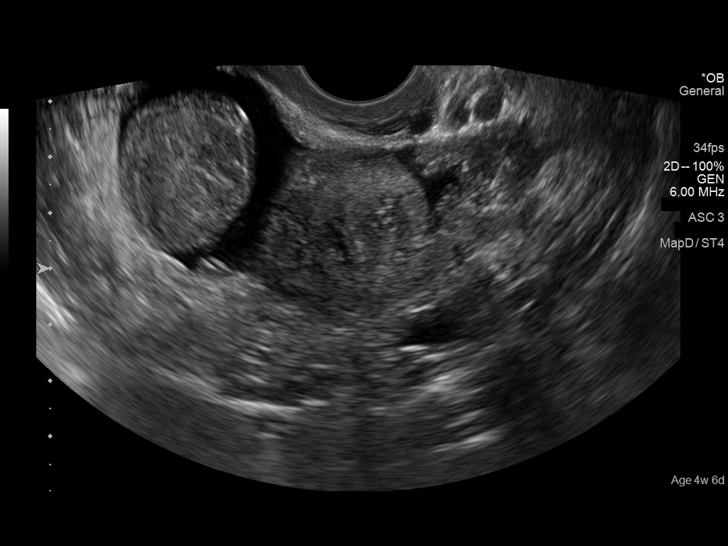
[im 49/89]
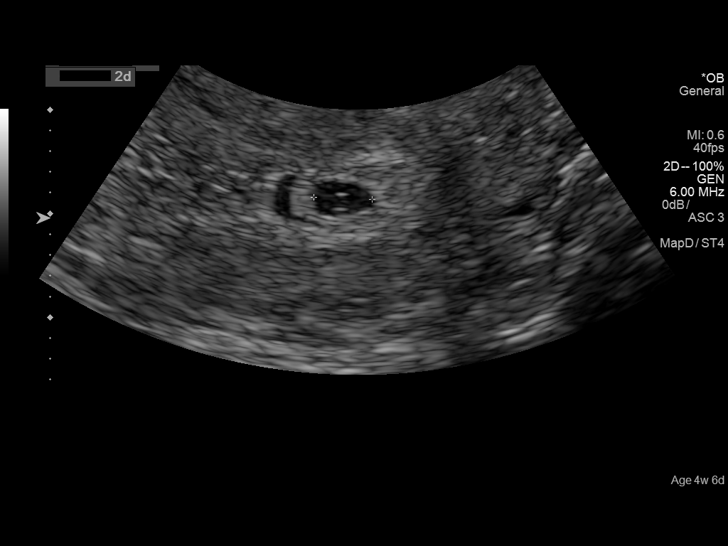
[im 56/89]
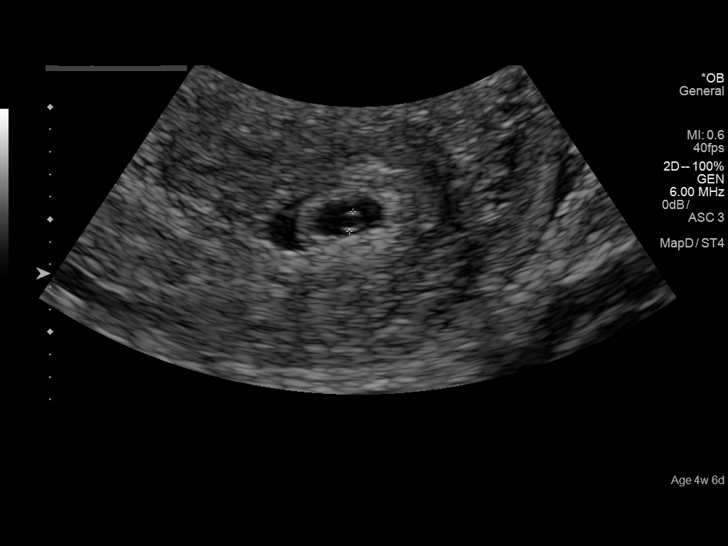
[im 62/89]
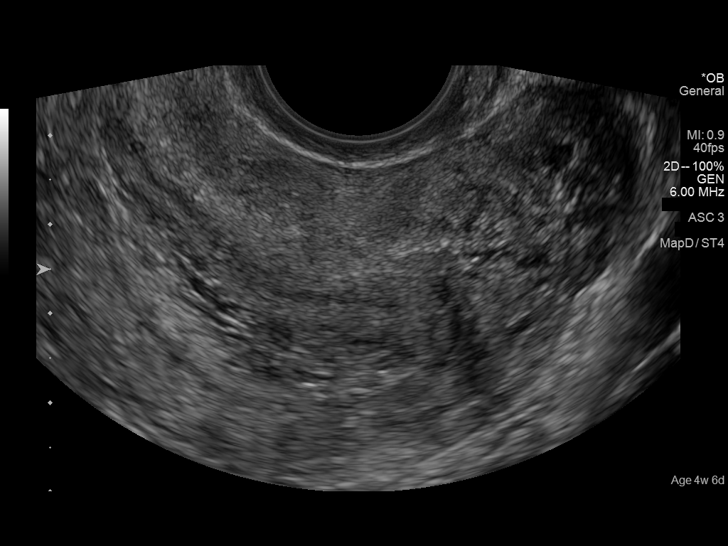
[im 69/89]
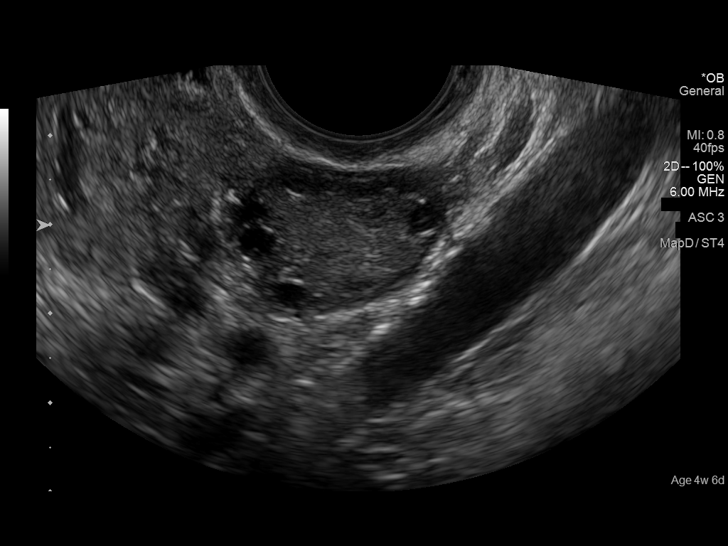
[im 75/89]
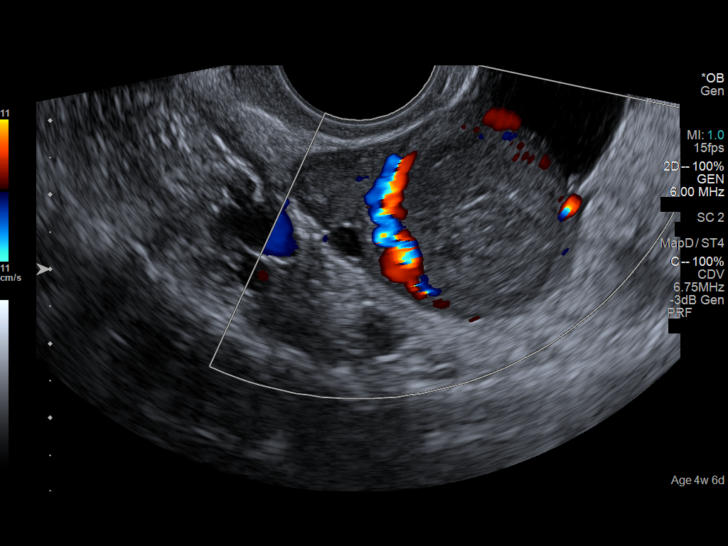
[im 82/89]
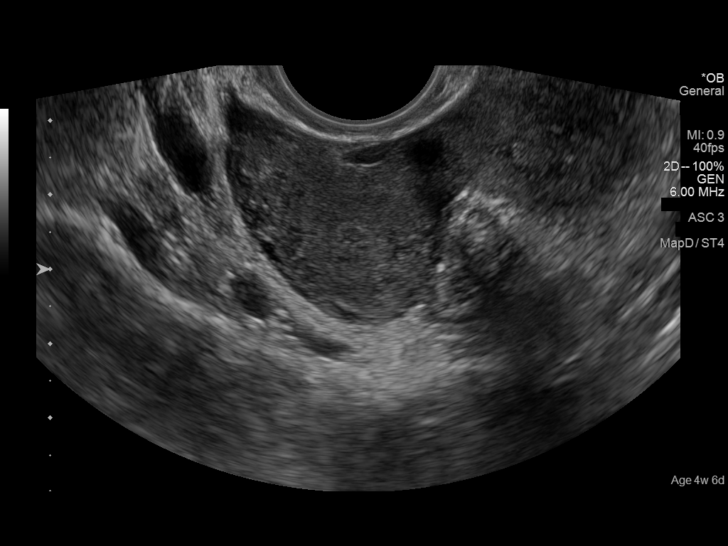
[im 89/89]
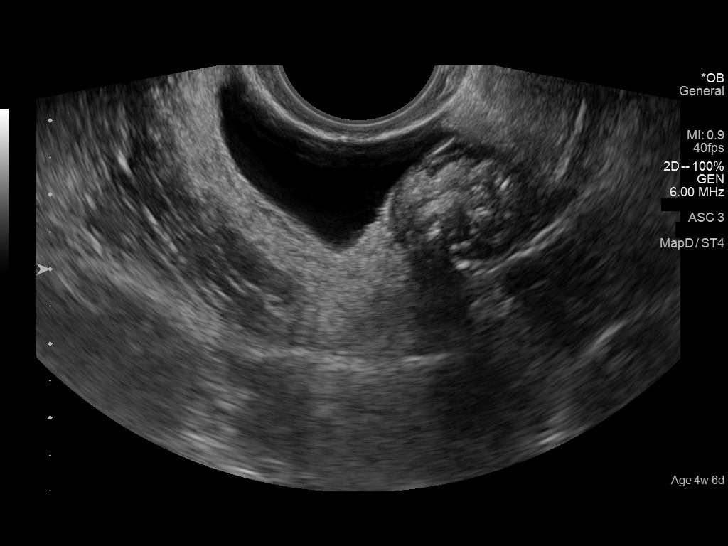

[14 of 28 positions shown; findings below may reference images not displayed]

FINDINGS: Intrauterine gestational sac: Single

Yolk sac:  Visualized.

Embryo:  Not Visualized.

MSD: 4.9  mm   5 w   2  d

Subchorionic hemorrhage: Small volume hypoechoic fluid along the
lower gestational sac measuring 5 mm.

Maternal uterus/adnexae: Corpus luteum on the right. Small simple
pelvic fluid. Unremarkable left ovary.
IMPRESSION: 1. Single intrauterine gestational sac measuring 5 weeks 2 days.
Yolk sac without visible fetal pole.
2. 5 mm subchorionic hemorrhage/fluid along the lower gestational
sac.
3. Small pelvic fluid around a right corpus luteum.

## 2019-10-05 ENCOUNTER — Encounter (HOSPITAL_COMMUNITY): Payer: Self-pay

## 2019-10-05 ENCOUNTER — Emergency Department (HOSPITAL_COMMUNITY)
Admission: EM | Admit: 2019-10-05 | Discharge: 2019-10-06 | Disposition: A | Discharge status: Home / Self Care | Payer: Self-pay | Attending: Emergency Medicine | Admitting: Emergency Medicine

## 2019-10-05 ENCOUNTER — Other Ambulatory Visit: Payer: Self-pay

## 2019-10-05 DIAGNOSIS — R110 Nausea: Secondary | ICD-10-CM | POA: Insufficient documentation

## 2019-10-05 DIAGNOSIS — Z20822 Contact with and (suspected) exposure to covid-19: Secondary | ICD-10-CM | POA: Insufficient documentation

## 2019-10-05 DIAGNOSIS — R1114 Bilious vomiting: Secondary | ICD-10-CM | POA: Insufficient documentation

## 2019-10-05 DIAGNOSIS — R1084 Generalized abdominal pain: Secondary | ICD-10-CM | POA: Insufficient documentation

## 2019-10-05 LAB — CBC
HCT: 40.8 % (ref 36.0–46.0)
Hemoglobin: 14.1 g/dL (ref 12.0–15.0)
MCH: 29.3 pg (ref 26.0–34.0)
MCHC: 34.6 g/dL (ref 30.0–36.0)
MCV: 84.6 fL (ref 80.0–100.0)
Platelets: 318 10*3/uL (ref 150–400)
RBC: 4.82 MIL/uL (ref 3.87–5.11)
RDW: 13 % (ref 11.5–15.5)
WBC: 11.4 10*3/uL — ABNORMAL HIGH (ref 4.0–10.5)
nRBC: 0 % (ref 0.0–0.2)

## 2019-10-05 LAB — URINALYSIS, ROUTINE W REFLEX MICROSCOPIC
Bilirubin Urine: NEGATIVE
Glucose, UA: NEGATIVE mg/dL
Hgb urine dipstick: NEGATIVE
Ketones, ur: NEGATIVE mg/dL
Nitrite: NEGATIVE
Protein, ur: NEGATIVE mg/dL
Specific Gravity, Urine: 1.025 (ref 1.005–1.030)
pH: 5 (ref 5.0–8.0)

## 2019-10-05 LAB — COMPREHENSIVE METABOLIC PANEL
ALT: 12 U/L (ref 0–44)
AST: 21 U/L (ref 15–41)
Albumin: 4.2 g/dL (ref 3.5–5.0)
Alkaline Phosphatase: 70 U/L (ref 38–126)
Anion gap: 11 (ref 5–15)
BUN: 13 mg/dL (ref 6–20)
CO2: 22 mmol/L (ref 22–32)
Calcium: 9.2 mg/dL (ref 8.9–10.3)
Chloride: 103 mmol/L (ref 98–111)
Creatinine, Ser: 0.59 mg/dL (ref 0.44–1.00)
GFR calc Af Amer: >60 mL/min (ref >60)
GFR calc non Af Amer: >60 mL/min (ref >60)
Glucose, Bld: 125 mg/dL — ABNORMAL HIGH (ref 70–99)
Potassium: 3.1 mmol/L — ABNORMAL LOW (ref 3.5–5.1)
Sodium: 136 mmol/L (ref 135–145)
Total Bilirubin: 0.8 mg/dL (ref 0.3–1.2)
Total Protein: 7.1 g/dL (ref 6.5–8.1)

## 2019-10-05 LAB — I-STAT BETA HCG BLOOD, ED (MC, WL, AP ONLY): I-stat hCG, quantitative: <5 m[IU]/mL (ref <5)

## 2019-10-05 LAB — LIPASE, BLOOD: Lipase: 30 U/L (ref 11–51)

## 2019-10-05 NOTE — ED Triage Notes (Signed)
Pt reports that she has been having upper abd pain and vomiting since 1pm today as well as lower back pain, denies fevers or diarrhea.

## 2019-10-06 LAB — SARS CORONAVIRUS 2 BY RT PCR (HOSPITAL ORDER, PERFORMED IN ~~LOC~~ HOSPITAL LAB): SARS Coronavirus 2: NEGATIVE

## 2019-10-06 MED ORDER — FAMOTIDINE 20 MG PO TABS: 20.0000 mg | ORAL_TABLET | Freq: Two times a day (BID) | ORAL | 0 refills | Status: DC

## 2019-10-06 MED ORDER — SUCRALFATE 1 GM/10ML PO SUSP
1.0000 g | Freq: Once | ORAL | Status: AC
  Administered 2019-10-06: 1 g via ORAL
  Filled 2019-10-06: qty 10

## 2019-10-06 MED ORDER — ONDANSETRON 4 MG PO TBDP: 4.0000 mg | ORAL_TABLET | Freq: Three times a day (TID) | ORAL | 0 refills | Status: DC | PRN

## 2019-10-06 MED ORDER — SODIUM CHLORIDE 0.9 % IV BOLUS
1000.0000 mL | Freq: Once | INTRAVENOUS | Status: AC
  Administered 2019-10-06: 1000 mL via INTRAVENOUS

## 2019-10-06 NOTE — ED Notes (Signed)
Patient given discharge instructions. Questions were answered. Patient verbalized understanding of discharge instructions and care at home.  

## 2019-10-06 NOTE — ED Provider Notes (Signed)
[facility] EMERGENCY DEPARTMENT Provider Note   CSN: [id number] Arrival date & time: 10/05/19  2210     History Chief Complaint  Patient presents with  . Abdominal Pain    Amber Murphy is a 29 y.o. female.  HPI    Previously well young female presents with 1 day of nausea, vomiting, abdominal pain. Patient has no prior surgical history, no medical problems, takes no medication regularly. There is no clear precipitant for her illness which began yesterday. About 12 hours ago she developed pain around the periumbilical area, epigastrium, bilateral posterior flanks.  Pain is sore, moderate, inconsistent. There is associated anorexia, nausea, she had several episodes of vomiting. No diarrhea, no fever. Symptoms have resolved without clear precipitant, and currently she has no pain, no nausea.  Past Medical History:  Diagnosis Date  . Medical history non-contributory   . No pertinent past medical history     Patient Active Problem List   Diagnosis Date Noted  . COVID-19 01/19/2019    Past Surgical History:  Procedure Laterality Date  . NO PAST SURGERIES       OB History    Gravida  4   Para  2   Term  2   Preterm  0   AB  1   Living  2     SAB  1   TAB  0   Ectopic  0   Multiple  0   Live Births  2           Family History  Problem Relation Age of Onset  . Early death Father     Social History   Tobacco Use  . Smoking status: Never Smoker  . Smokeless tobacco: Never Used  Substance Use Topics  . Alcohol use: No  . Drug use: No    Home Medications Prior to Admission medications   Medication Sig Start Date End Date Taking? Authorizing Provider  Prenatal MV-Min-Fe Fum-FA-DHA ([employer] WOMENS PRENATAL+DHA) 28-0.975 & 200 MG MISC Take 1 tablet by mouth daily. 08/10/16   Shaune Pollack, MD    Allergies    Patient has no known allergies.  Review of Systems   Review of Systems  Constitutional:       Per HPI,  otherwise negative  HENT:       Per HPI, otherwise negative  Respiratory:       Per HPI, otherwise negative  Cardiovascular:       Per HPI, otherwise negative  Gastrointestinal: Positive for abdominal pain, nausea and vomiting.  Endocrine:       Negative aside from HPI  Genitourinary:       Neg aside from HPI   Musculoskeletal:       Per HPI, otherwise negative  Skin: Negative.   Neurological: Negative for syncope.    Physical Exam Updated Vital Signs BP 107/82   Pulse 82   Temp 97.9 F (36.6 C) (Oral)   Resp 20   SpO2 100%   Physical Exam Vitals and nursing note reviewed.  Constitutional:      General: She is not in acute distress.    Appearance: She is well-developed.  HENT:     Head: Normocephalic and atraumatic.  Eyes:     Conjunctiva/sclera: Conjunctivae normal.  Cardiovascular:     Rate and Rhythm: Normal rate and regular rhythm.  Pulmonary:     Effort: Pulmonary effort is normal. No respiratory distress.     Breath sounds: Normal breath sounds. No  stridor.  Abdominal:     General: There is no distension.     Tenderness: There is no abdominal tenderness. There is no guarding. Negative signs include Murphy's sign and McBurney's sign.  Skin:    General: Skin is warm and dry.  Neurological:     Mental Status: She is alert and oriented to person, place, and time.     Cranial Nerves: No cranial nerve deficit.     ED Results / Procedures / Treatments   Labs (all labs ordered are listed, but only abnormal results are displayed) Labs Reviewed  COMPREHENSIVE METABOLIC PANEL - Abnormal; Notable for the following components:      Result Value   Potassium 3.1 (*)    Glucose, Bld 125 (*)    All other components within normal limits  CBC - Abnormal; Notable for the following components:   WBC 11.4 (*)    All other components within normal limits  URINALYSIS, ROUTINE W REFLEX MICROSCOPIC - Abnormal; Notable for the following components:   APPearance HAZY (*)      Leukocytes,Ua SMALL (*)    Bacteria, UA RARE (*)    All other components within normal limits  LIPASE, BLOOD  I-STAT BETA HCG BLOOD, ED ([facility], [facility], [facility] ONLY)    Procedures Procedures (including critical care time)  Medications Ordered in ED Medications  sodium chloride 0.9 % bolus 1,000 mL (1,000 mLs Intravenous New Bag/Given 10/06/19 0924)  sucralfate (CARAFATE) 1 GM/10ML suspension 1 g (1 g Oral Given 10/06/19 1012)    ED Course  I have reviewed the triage vital signs and the nursing notes.  Pertinent labs & imaging results that were available during my care of the patient were reviewed by me and considered in my medical decision making (see chart for details).     10:29 AM Patient awake, alert, sitting upright, hemodynamically unremarkable, states that she feels better. She has received Carafate, is receiving IV fluids.  MDM Rules/Calculators/A&P    Healthy adult female presents with 1 day of abdominal pain, nausea, vomiting, which is resolved.  With her history, hepatobiliary dysfunction, pancreatitis, gastritis, appendicitis all considered. Patient's labs, physical exam reassuring (mild hypoK noted), no leukocytosis, no abdominal pain on exam. Patient is afebrile, improved here with fluids, Carafate, and absent alarming findings, as above, patient is appropriate for discharge with close outpatient follow-up. Patient did have Covid test sent on discharge.   Final Clinical Impression(s) / ED Diagnoses Final diagnoses:  Generalized abdominal pain  Bilious vomiting with nausea    Rx / DC Orders ED Discharge Orders         Ordered    famotidine (PEPCID) 20 MG tablet  2 times daily        10/06/19 1032    ondansetron (ZOFRAN ODT) 4 MG disintegrating tablet  Every 8 hours PRN        10/06/19 1032           Gerhard Munch, MD 10/06/19 [phone]

## 2019-10-06 NOTE — Discharge Instructions (Addendum)
As discussed, your evaluation today has been largely reassuring.  But, it is important that you monitor your condition carefully, and do not hesitate to return to the ED if you develop new, or concerning changes in your condition.  Otherwise, please follow-up with your physician for appropriate ongoing care.  A Covid test has been performed. These results should be available later today.

## 2020-10-10 LAB — OB RESULTS CONSOLE ABO/RH: RH Type: POSITIVE

## 2020-10-10 LAB — OB RESULTS CONSOLE GBS: GBS: POSITIVE

## 2020-10-10 LAB — OB RESULTS CONSOLE RPR: RPR: NONREACTIVE

## 2020-10-10 LAB — OB RESULTS CONSOLE RUBELLA ANTIBODY, IGM: Rubella: IMMUNE

## 2020-10-10 LAB — OB RESULTS CONSOLE HEPATITIS B SURFACE ANTIGEN: Hepatitis B Surface Ag: NEGATIVE

## 2020-10-10 LAB — OB RESULTS CONSOLE HIV ANTIBODY (ROUTINE TESTING): HIV: NONREACTIVE

## 2020-10-10 LAB — OB RESULTS CONSOLE ANTIBODY SCREEN: Antibody Screen: NEGATIVE

## 2020-10-10 LAB — OB RESULTS CONSOLE GC/CHLAMYDIA
Chlamydia: NEGATIVE
Gonorrhea: NEGATIVE

## 2020-10-10 LAB — HEPATITIS C ANTIBODY: HCV Ab: NEGATIVE

## 2020-10-12 ENCOUNTER — Other Ambulatory Visit: Payer: Self-pay | Admitting: Family Medicine

## 2020-10-12 DIAGNOSIS — Z3A13 13 weeks gestation of pregnancy: Secondary | ICD-10-CM

## 2020-10-12 DIAGNOSIS — Z369 Encounter for antenatal screening, unspecified: Secondary | ICD-10-CM

## 2020-10-15 ENCOUNTER — Other Ambulatory Visit: Payer: Self-pay

## 2020-10-15 ENCOUNTER — Inpatient Hospital Stay (HOSPITAL_COMMUNITY)
Admission: AD | Admit: 2020-10-15 | Discharge: 2020-10-16 | Disposition: A | Discharge status: Home / Self Care | Payer: Self-pay | Attending: Obstetrics & Gynecology | Admitting: Obstetrics & Gynecology

## 2020-10-15 ENCOUNTER — Encounter (HOSPITAL_COMMUNITY): Payer: Self-pay | Admitting: Emergency Medicine

## 2020-10-15 DIAGNOSIS — R519 Headache, unspecified: Secondary | ICD-10-CM | POA: Insufficient documentation

## 2020-10-15 DIAGNOSIS — O26892 Other specified pregnancy related conditions, second trimester: Secondary | ICD-10-CM | POA: Insufficient documentation

## 2020-10-15 DIAGNOSIS — O26891 Other specified pregnancy related conditions, first trimester: Secondary | ICD-10-CM

## 2020-10-15 DIAGNOSIS — O219 Vomiting of pregnancy, unspecified: Secondary | ICD-10-CM | POA: Insufficient documentation

## 2020-10-15 DIAGNOSIS — Z3A11 11 weeks gestation of pregnancy: Secondary | ICD-10-CM | POA: Insufficient documentation

## 2020-10-15 DIAGNOSIS — O368390 Maternal care for abnormalities of the fetal heart rate or rhythm, unspecified trimester, not applicable or unspecified: Secondary | ICD-10-CM

## 2020-10-15 LAB — WET PREP, GENITAL
Clue Cells Wet Prep HPF POC: NONE SEEN
Sperm: NONE SEEN
Trich, Wet Prep: NONE SEEN
Yeast Wet Prep HPF POC: NONE SEEN

## 2020-10-15 LAB — I-STAT BETA HCG BLOOD, ED (MC, WL, AP ONLY): I-stat hCG, quantitative: >2000 m[IU]/mL — ABNORMAL HIGH (ref <5)

## 2020-10-15 NOTE — ED Provider Notes (Signed)
Emergency Medicine Provider Triage Evaluation Note  Amber Murphy , a 29 y.o. female  was evaluated in triage.   11 w pregnant. N/v for 2 weeks. Not keeping food or water down. [id number]. No vaginal discharge or bleeding. Complains of suprapubic pelvic pain since yesterday.   Review of Systems  Positive: Pelvic pain, n/v Negative: Vaginal bleeding/discharge, fever  Physical Exam  BP 114/87   Pulse 92   Temp 98.7 F (37.1 C) (Oral)   Resp 17   SpO2 98%  Gen:   Awake, no distress   Resp:  Normal effort  MSK:   Moves extremities without difficulty  Other:    Medical Decision Making  Medically screening exam initiated at 9:40 PM.  Appropriate orders placed.  Amber Murphy was informed that the remainder of the evaluation will be completed by another provider, this initial triage assessment does not replace that evaluation, and the importance of remaining in the ED until their evaluation is complete.  2144: spoke with MAU provider. Once positive pregnancy confirmed will send patient to MAU.      Therese Sarah 10/15/20 2145    Franne Forts, DO 10/16/20 1137

## 2020-10-15 NOTE — ED Triage Notes (Signed)
Pt states she is [redacted]wks pregnant, c/o nausea/vomiting and lower abd pain.Denies vaginal bleeding.

## 2020-10-15 NOTE — MAU Note (Signed)
Nausea and vomit for 2 weeks.  Pregnant and had first appt at health dept No bleeding. Low abd pain when walks, pressure feeling.

## 2020-10-15 NOTE — ED Notes (Signed)
Informed Callie - Triage RN of pt's hCG result.

## 2020-10-16 DIAGNOSIS — R519 Headache, unspecified: Secondary | ICD-10-CM

## 2020-10-16 DIAGNOSIS — Z3A11 11 weeks gestation of pregnancy: Secondary | ICD-10-CM

## 2020-10-16 DIAGNOSIS — O210 Mild hyperemesis gravidarum: Secondary | ICD-10-CM

## 2020-10-16 DIAGNOSIS — O26891 Other specified pregnancy related conditions, first trimester: Secondary | ICD-10-CM

## 2020-10-16 LAB — URINALYSIS, ROUTINE W REFLEX MICROSCOPIC
Bilirubin Urine: NEGATIVE
Glucose, UA: NEGATIVE mg/dL
Hgb urine dipstick: NEGATIVE
Ketones, ur: 20 mg/dL — AB
Nitrite: NEGATIVE
Protein, ur: 100 mg/dL — AB
Specific Gravity, Urine: 1.027 (ref 1.005–1.030)
WBC, UA: >50 WBC/hpf — ABNORMAL HIGH (ref 0–5)
pH: 5 (ref 5.0–8.0)

## 2020-10-16 MED ORDER — METOCLOPRAMIDE HCL 5 MG/ML IJ SOLN
10.0000 mg | Freq: Once | INTRAMUSCULAR | Status: AC
  Administered 2020-10-16: 10 mg via INTRAVENOUS
  Filled 2020-10-16: qty 2

## 2020-10-16 MED ORDER — ONDANSETRON 4 MG PO TBDP: 4.0000 mg | ORAL_TABLET | Freq: Four times a day (QID) | ORAL | 0 refills | Status: DC | PRN

## 2020-10-16 MED ORDER — LACTATED RINGERS IV BOLUS
1000.0000 mL | Freq: Once | INTRAVENOUS | Status: AC
  Administered 2020-10-16: 1000 mL via INTRAVENOUS

## 2020-10-16 MED ORDER — METOCLOPRAMIDE HCL 10 MG PO TABS: 10.0000 mg | ORAL_TABLET | Freq: Four times a day (QID) | ORAL | 0 refills | Status: DC

## 2020-10-16 MED ORDER — SODIUM CHLORIDE 0.9 % IV SOLN
8.0000 mg | Freq: Once | INTRAVENOUS | Status: AC
  Administered 2020-10-16: 8 mg via INTRAVENOUS
  Filled 2020-10-16: qty 4

## 2020-10-16 MED ORDER — FAMOTIDINE IN NACL 20-0.9 MG/50ML-% IV SOLN
20.0000 mg | Freq: Once | INTRAVENOUS | Status: AC
  Administered 2020-10-16: 20 mg via INTRAVENOUS
  Filled 2020-10-16: qty 50

## 2020-10-16 NOTE — MAU Provider Note (Signed)
History     CSN: [id number]  Arrival date and time: 10/15/20 2132   Event Date/Time   First Provider Initiated Contact with Patient 10/16/20 0011      Chief Complaint  Patient presents with   Abdominal Pain   Amber Murphy is a 30 y.o. [id number] at [redacted]w[redacted]d by Definite LMP of July 1st who receives care at [facility].  She presents today for Nausea. She states it started about 2 weeks ago and was taking Vitamin B and "the pill for sleep."  She states it was not working and has thrown up "all day" today.  She reports trying to eat fruits, juice, chicken without success.  She states she experienced similar symptoms with previous pregnancies and had good outcomes with zofran usage.  Of note, patient is actively vomiting while speaking with provider.    OB History     Gravida  5   Para  2   Term  2   Preterm  0   AB  1   Living  2      SAB  1   IAB  0   Ectopic  0   Multiple  0   Live Births  2           Past Medical History:  Diagnosis Date   Medical history non-contributory    No pertinent past medical history     Past Surgical History:  Procedure Laterality Date   NO PAST SURGERIES      Family History  Problem Relation Age of Onset   Early death Father     Social History   Tobacco Use   Smoking status: Never   Smokeless tobacco: Never  Vaping Use   Vaping Use: Never used  Substance Use Topics   Alcohol use: No   Drug use: No    Allergies: No Known Allergies  Medications Prior to Admission  Medication Sig Dispense Refill Last Dose   Prenatal MV-Min-Fe Fum-FA-DHA ([employer] WOMENS PRENATAL+DHA) 28-0.975 & 200 MG MISC Take 1 tablet by mouth daily. 60 each 0 Past Month   famotidine (PEPCID) 20 MG tablet Take 1 tablet (20 mg total) by mouth 2 (two) times daily for 10 days. 20 tablet 0    ondansetron (ZOFRAN ODT) 4 MG disintegrating tablet Take 1 tablet (4 mg total) by mouth every 8 (eight) hours as needed for nausea or vomiting. 20 tablet 0      Review of Systems  Gastrointestinal:  Positive for abdominal pain (Upper Epigastric and Lower with urination), nausea and vomiting. Negative for constipation and diarrhea.  Genitourinary:  Positive for dysuria. Negative for difficulty urinating, vaginal bleeding and vaginal discharge.  Neurological:  Positive for headaches (6/10 Temporal). Negative for dizziness and light-headedness.  Physical Exam   Blood pressure 103/72, pulse 95, temperature 98.4 F (36.9 C), temperature source Oral, resp. rate 16, height 5\' 2"  (1.575 m), weight 66.2 kg, SpO2 99 %, unknown if currently breastfeeding.  Physical Exam Vitals reviewed.  Constitutional:      Appearance: Normal appearance. She is well-developed.  HENT:     Head: Normocephalic and atraumatic.  Eyes:     Conjunctiva/sclera: Conjunctivae normal.  Cardiovascular:     Rate and Rhythm: Normal rate.  Pulmonary:     Effort: Pulmonary effort is normal. No respiratory distress.  Abdominal:     General: Abdomen is flat.     Palpations: Abdomen is soft.     Tenderness: There is no abdominal tenderness.  Musculoskeletal:  General: Normal range of motion.     Cervical back: Normal range of motion.  Skin:    General: Skin is warm and dry.  Neurological:     Mental Status: She is alert and oriented to person, place, and time.  Psychiatric:        Mood and Affect: Mood normal.        Behavior: Behavior normal.        Thought Content: Thought content normal.    MAU Course  Procedures Results for orders placed or performed during the hospital encounter of 10/15/20 (from the past 24 hour(s))  I-Stat beta hCG blood, ED     Status: Abnormal   Collection Time: 10/15/20  9:48 PM  Result Value Ref Range   I-stat hCG, quantitative >2,000.0 (H) <5 mIU/mL   Comment 3          Wet prep, genital     Status: Abnormal   Collection Time: 10/15/20 10:54 PM   Specimen: PATH Cytology Cervicovaginal Ancillary Only  Result Value Ref Range    Yeast Wet Prep HPF POC NONE SEEN NONE SEEN   Trich, Wet Prep NONE SEEN NONE SEEN   Clue Cells Wet Prep HPF POC NONE SEEN NONE SEEN   WBC, Wet Prep HPF POC MODERATE (A) NONE SEEN   Sperm NONE SEEN   Urinalysis, Routine w reflex microscopic Urine, Clean Catch     Status: Abnormal   Collection Time: 10/15/20 11:30 PM  Result Value Ref Range   Color, Urine AMBER (A) YELLOW   APPearance CLOUDY (A) CLEAR   Specific Gravity, Urine 1.027 1.005 - 1.030   pH 5.0 5.0 - 8.0   Glucose, UA NEGATIVE NEGATIVE mg/dL   Hgb urine dipstick NEGATIVE NEGATIVE   Bilirubin Urine NEGATIVE NEGATIVE   Ketones, ur 20 (A) NEGATIVE mg/dL   Protein, ur 956 (A) NEGATIVE mg/dL   Nitrite NEGATIVE NEGATIVE   Leukocytes,Ua LARGE (A) NEGATIVE   RBC / HPF 6-10 0 - 5 RBC/hpf   WBC, UA >50 (H) 0 - 5 WBC/hpf   Bacteria, UA FEW (A) NONE SEEN   Squamous Epithelial / LPF 21-50 0 - 5   Mucus PRESENT         MDM Start IV LR Bolus f/b Banana Bag Antiemetic PPI BSUS Assessment and Plan  30 year old [id number] at 11.2 weeks N/V Headache  -Reviewed POC with patient. -Exam performed -Cultures collected via self swab. -Discussed usage of Zofran for nausea. -If able to tolerate fluids, will consider oral medication for HA management. -UA with few bacteria and large leusk, will send for culture. -Start IV and give fluids  -Will also give pepcid -Will monitor and reasses  Cherre Robins 10/16/2020, 12:11 AM   Reassessment (1:59 AM) -Nurse reports patient with continued c/o headache, but doesn't think she can tolerate po yet. -Will give reglan now.  Reassessment (3:21 AM)  -Reports improvement in HA with Reglan dosing. Now 4/10. -BSUS performed to confirm viability/FHR.  -Pictures taken and given to patient. -Discussed management of nausea at home.   -Will send in script for Reglan and Zofran. -Instructed to continue usage of unisom and B6. -Precautions Given. -Encouraged to call primary office or return to  MAU if symptoms worsen or with the onset of new symptoms. -Discharged to home in stable condition.  Cherre Robins MSN, CNM Advanced Practice Provider, [facility]

## 2020-10-17 LAB — GC/CHLAMYDIA PROBE AMP (~~LOC~~) NOT AT ARMC
Chlamydia: NEGATIVE
Comment: NEGATIVE
Comment: NORMAL
Neisseria Gonorrhea: NEGATIVE

## 2020-10-31 ENCOUNTER — Other Ambulatory Visit: Payer: Self-pay

## 2020-10-31 ENCOUNTER — Encounter: Payer: Self-pay | Admitting: *Deleted

## 2020-10-31 ENCOUNTER — Ambulatory Visit: Payer: Self-pay | Admitting: *Deleted

## 2020-10-31 ENCOUNTER — Ambulatory Visit: Payer: Self-pay | Attending: Family Medicine

## 2020-10-31 ENCOUNTER — Ambulatory Visit: Payer: Self-pay

## 2020-10-31 VITALS — BP 95/72 | HR 82 | Wt 145.0 lb

## 2020-10-31 DIAGNOSIS — Z3A13 13 weeks gestation of pregnancy: Secondary | ICD-10-CM | POA: Insufficient documentation

## 2020-10-31 DIAGNOSIS — Z3682 Encounter for antenatal screening for nuchal translucency: Secondary | ICD-10-CM

## 2020-10-31 DIAGNOSIS — Z369 Encounter for antenatal screening, unspecified: Secondary | ICD-10-CM

## 2020-11-02 LAB — FIRST TRIMESTER SCREEN W/NT
CRL: 64.1 mm
DIA MoM: 1.59
DIA Value: 366.8 pg/mL
Gest Age-Collect: 12.6 weeks
Maternal Age At EDD: 31.6 yr
Nuchal Translucency MoM: 1.19
Nuchal Translucency: 1.6 mm
Number of Fetuses: 1
PAPP-A MoM: 1.1
PAPP-A Value: 1127.8 ng/mL
Test Results:: NEGATIVE
Weight: 145 [lb_av]
hCG MoM: 1.71
hCG Value: 170.3 IU/mL

## 2020-11-14 ENCOUNTER — Other Ambulatory Visit: Payer: Self-pay

## 2021-01-29 NOTE — L&D Delivery Note (Signed)
Delivery Note ?Called to room and patient was complete and pushing. Head delivered ROA. Nuchal cord present, loose and easily removed. Shoulder and body delivered in usual fashion with right arm up. At  ?1310 a viable and healthy female was delivered via Vaginal, Spontaneous (Presentation: ROA).  Infant with spontaneous cry, placed on mother's abdomen, dried and stimulated. Cord clamped x 2 after 2-minute delay, and cut by father of baby. Cord blood drawn. Placenta delivered spontaneously with gentle cord traction. Appears intact. Fundus firm with massage and Pitocin. Labia, perineum, vagina, and cervix inspected, found to have a 1st degree tear that did not achieve hemostasis with pressure.    ? ? ?APGAR: 8, 9  ?Cord: 3VC with no complications   ? ?Anesthesia:   ?Episiotomy: None ?Lacerations: 1st degree  ?Suture Repair: 3.0 vicryl ?Est. Blood Loss (mL): 300 ? ?Mom to postpartum.  Baby to Couplet care / Skin to Skin. ? ?Noralee Stain, DO, PGY-1 ?05/10/21 ?1:47 PM    ? ?I attest that I was gowned and gloved for the entirety of this delivery and repair.  ? ?Starr Lake, CNM ?[facility] [place] ?05/10/2021 3:33 PM ? ?

## 2021-04-19 ENCOUNTER — Other Ambulatory Visit: Payer: Self-pay

## 2021-04-19 ENCOUNTER — Inpatient Hospital Stay (HOSPITAL_COMMUNITY)
Admission: AD | Admit: 2021-04-19 | Discharge: 2021-04-19 | Disposition: A | Discharge status: Home / Self Care | Payer: Self-pay | Attending: Obstetrics & Gynecology | Admitting: Obstetrics & Gynecology

## 2021-04-19 ENCOUNTER — Encounter (HOSPITAL_COMMUNITY): Payer: Self-pay | Admitting: Obstetrics & Gynecology

## 2021-04-19 DIAGNOSIS — R1030 Lower abdominal pain, unspecified: Secondary | ICD-10-CM | POA: Insufficient documentation

## 2021-04-19 DIAGNOSIS — N361 Urethral diverticulum: Secondary | ICD-10-CM

## 2021-04-19 DIAGNOSIS — Z3A37 37 weeks gestation of pregnancy: Secondary | ICD-10-CM | POA: Insufficient documentation

## 2021-04-19 DIAGNOSIS — O26893 Other specified pregnancy related conditions, third trimester: Secondary | ICD-10-CM | POA: Insufficient documentation

## 2021-04-19 DIAGNOSIS — O26833 Pregnancy related renal disease, third trimester: Secondary | ICD-10-CM | POA: Insufficient documentation

## 2021-04-19 LAB — CBC WITH DIFFERENTIAL/PLATELET
Abs Immature Granulocytes: 0.05 10*3/uL (ref 0.00–0.07)
Basophils Absolute: 0 10*3/uL (ref 0.0–0.1)
Basophils Relative: 0 %
Eosinophils Absolute: 0.1 10*3/uL (ref 0.0–0.5)
Eosinophils Relative: 1 %
HCT: 34.2 % — ABNORMAL LOW (ref 36.0–46.0)
Hemoglobin: 11.8 g/dL — ABNORMAL LOW (ref 12.0–15.0)
Immature Granulocytes: 1 %
Lymphocytes Relative: 22 %
Lymphs Abs: 1.9 10*3/uL (ref 0.7–4.0)
MCH: 28.6 pg (ref 26.0–34.0)
MCHC: 34.5 g/dL (ref 30.0–36.0)
MCV: 83 fL (ref 80.0–100.0)
Monocytes Absolute: 0.8 10*3/uL (ref 0.1–1.0)
Monocytes Relative: 9 %
Neutro Abs: 5.8 10*3/uL (ref 1.7–7.7)
Neutrophils Relative %: 67 %
Platelets: 249 10*3/uL (ref 150–400)
RBC: 4.12 MIL/uL (ref 3.87–5.11)
RDW: 13.8 % (ref 11.5–15.5)
WBC: 8.6 10*3/uL (ref 4.0–10.5)
nRBC: 0 % (ref 0.0–0.2)

## 2021-04-19 LAB — WET PREP, GENITAL
Sperm: NONE SEEN
Trich, Wet Prep: NONE SEEN
WBC, Wet Prep HPF POC: >=10 — AB (ref <10)
Yeast Wet Prep HPF POC: NONE SEEN

## 2021-04-19 LAB — BASIC METABOLIC PANEL
Anion gap: 9 (ref 5–15)
BUN: 9 mg/dL (ref 6–20)
CO2: 19 mmol/L — ABNORMAL LOW (ref 22–32)
Calcium: 8.8 mg/dL — ABNORMAL LOW (ref 8.9–10.3)
Chloride: 109 mmol/L (ref 98–111)
Creatinine, Ser: 0.5 mg/dL (ref 0.44–1.00)
GFR, Estimated: >60 mL/min (ref >60)
Glucose, Bld: 85 mg/dL (ref 70–99)
Potassium: 3.5 mmol/L (ref 3.5–5.1)
Sodium: 137 mmol/L (ref 135–145)

## 2021-04-19 LAB — URINALYSIS, ROUTINE W REFLEX MICROSCOPIC
Bilirubin Urine: NEGATIVE
Glucose, UA: NEGATIVE mg/dL
Ketones, ur: NEGATIVE mg/dL
Nitrite: NEGATIVE
Protein, ur: 30 mg/dL — AB
Specific Gravity, Urine: 1.018 (ref 1.005–1.030)
WBC, UA: >50 WBC/hpf — ABNORMAL HIGH (ref 0–5)
pH: 7 (ref 5.0–8.0)

## 2021-04-19 MED ORDER — CEPHALEXIN 500 MG PO CAPS: 500.0000 mg | ORAL_CAPSULE | Freq: Every day | ORAL | 0 refills | Status: DC

## 2021-04-19 NOTE — MAU Provider Note (Signed)
?History  ?  ? ?CSN: [id number] ? ?Arrival date and time: 04/19/21 1604 ? ? Event Date/Time  ? First Provider Initiated Contact with Patient 04/19/21 1651   ?  ? ?Chief Complaint  ?Patient presents with  ? pelvic pressure  ? Groin Pain  ? ?HPI ?Amber Murphy is a 31 y.o. [id number] at 3216w5d who presents with pressure in her vagina. She reports feeling a bulge on the inside. She reports she first noticed the bulge about a month ago. She reports it is painful when she sits up. She also reports more frequent loss of urine since she noticed the bulge. She denies any abdominal pain, vaginal bleeding or discharge. Reports normal fetal movement.  ? ?OB History   ? ? Gravida  ?5  ? Para  ?2  ? Term  ?2  ? Preterm  ?0  ? AB  ?1  ? Living  ?2  ?  ? ? SAB  ?1  ? IAB  ?0  ? Ectopic  ?0  ? Multiple  ?0  ? Live Births  ?2  ?   ?  ?  ? ? ?Past Medical History:  ?Diagnosis Date  ? Medical history non-contributory   ? No pertinent past medical history   ? ? ?Past Surgical History:  ?Procedure Laterality Date  ? NO PAST SURGERIES    ? ? ?Family History  ?Problem Relation Age of Onset  ? Early death Father   ? ? ?Social History  ? ?Tobacco Use  ? Smoking status: Never  ? Smokeless tobacco: Never  ?Vaping Use  ? Vaping Use: Never used  ?Substance Use Topics  ? Alcohol use: No  ? Drug use: No  ? ? ?Allergies: No Known Allergies ? ?Medications Prior to Admission  ?Medication Sig Dispense Refill Last Dose  ? Prenatal MV-Min-Fe Fum-FA-DHA ([employer] WOMENS PRENATAL+DHA) 28-0.975 & 200 MG MISC Take 1 tablet by mouth daily. 60 each 0 04/18/2021  ? metoCLOPramide (REGLAN) 10 MG tablet Take 1 tablet (10 mg total) by mouth every 6 (six) hours. (Patient not taking: Reported on 10/31/2020) 30 tablet 0   ? ondansetron (ZOFRAN ODT) 4 MG disintegrating tablet Take 1-2 tablets (4-8 mg total) by mouth every 6 (six) hours as needed for nausea or vomiting. (Patient not taking: Reported on 10/31/2020) 20 tablet 0   ? ? ?Review of Systems  ?Constitutional:  Negative.  Negative for fatigue and fever.  ?HENT: Negative.    ?Respiratory: Negative.  Negative for shortness of breath.   ?Cardiovascular: Negative.  Negative for chest pain.  ?Gastrointestinal: Negative.  Negative for abdominal pain, constipation, diarrhea, nausea and vomiting.  ?Genitourinary:  Positive for vaginal pain. Negative for dysuria.  ?Neurological: Negative.  Negative for dizziness and headaches.  ?Physical Exam  ? ?Blood pressure 115/80, pulse 89, temperature 97.9 ?F (36.6 ?C), temperature source Oral, resp. rate 18, height 5\' 2"  (1.575 m), weight 76 kg, last menstrual period 07/29/2020, SpO2 99 %, unknown if currently breastfeeding. ? ?Physical Exam ?Vitals and nursing note reviewed.  ?Constitutional:   ?   General: She is not in acute distress. ?   Appearance: She is well-developed.  ?HENT:  ?   Head: Normocephalic.  ?Eyes:  ?   Pupils: Pupils are equal, round, and reactive to light.  ?Cardiovascular:  ?   Rate and Rhythm: Normal rate and regular rhythm.  ?   Heart sounds: Normal heart sounds.  ?Pulmonary:  ?   Effort: Pulmonary effort is normal. No respiratory distress.  ?  Breath sounds: Normal breath sounds.  ?Abdominal:  ?   General: Bowel sounds are normal. There is no distension.  ?   Palpations: Abdomen is soft.  ?   Tenderness: There is no abdominal tenderness.  ?Genitourinary: ?   Comments: Anterior inside vaginal introitus is 3-4cm soft bulge, painful to touch, unable to compress, immobile ?Skin: ?   General: Skin is warm and dry.  ?Neurological:  ?   Mental Status: She is alert and oriented to person, place, and time.  ?Psychiatric:     ?   Mood and Affect: Mood normal.     ?   Behavior: Behavior normal.     ?   Thought Content: Thought content normal.     ?   Judgment: Judgment normal.  ? ?Fetal Tracing: ? ?Baseline: 125 ?Variability: moderate ?Accels: 15x15 ?Decels: none ? ?Toco: none ? ? ?MAU Course  ?Procedures ?Results for orders placed or performed during the hospital encounter of  04/19/21 (from the past 24 hour(s))  ?Urinalysis, Routine w reflex microscopic Urine, Clean Catch     Status: Abnormal  ? Collection Time: 04/19/21  4:04 PM  ?Result Value Ref Range  ? Color, Urine YELLOW YELLOW  ? APPearance HAZY (A) CLEAR  ? Specific Gravity, Urine 1.018 1.005 - 1.030  ? pH 7.0 5.0 - 8.0  ? Glucose, UA NEGATIVE NEGATIVE mg/dL  ? Hgb urine dipstick SMALL (A) NEGATIVE  ? Bilirubin Urine NEGATIVE NEGATIVE  ? Ketones, ur NEGATIVE NEGATIVE mg/dL  ? Protein, ur 30 (A) NEGATIVE mg/dL  ? Nitrite NEGATIVE NEGATIVE  ? Leukocytes,Ua LARGE (A) NEGATIVE  ? RBC / HPF 6-10 0 - 5 RBC/hpf  ? WBC, UA >50 (H) 0 - 5 WBC/hpf  ? Bacteria, UA MANY (A) NONE SEEN  ? Squamous Epithelial / LPF 11-20 0 - 5  ? Mucus PRESENT   ? Non Squamous Epithelial 0-5 (A) NONE SEEN  ?CBC with Differential/Platelet     Status: Abnormal  ? Collection Time: 04/19/21  5:46 PM  ?Result Value Ref Range  ? WBC 8.6 4.0 - 10.5 K/uL  ? RBC 4.12 3.87 - 5.11 MIL/uL  ? Hemoglobin 11.8 (L) 12.0 - 15.0 g/dL  ? HCT 34.2 (L) 36.0 - 46.0 %  ? MCV 83.0 80.0 - 100.0 fL  ? MCH 28.6 26.0 - 34.0 pg  ? MCHC 34.5 30.0 - 36.0 g/dL  ? RDW 13.8 11.5 - 15.5 %  ? Platelets 249 150 - 400 K/uL  ? nRBC 0.0 0.0 - 0.2 %  ? Neutrophils Relative % 67 %  ? Neutro Abs 5.8 1.7 - 7.7 K/uL  ? Lymphocytes Relative 22 %  ? Lymphs Abs 1.9 0.7 - 4.0 K/uL  ? Monocytes Relative 9 %  ? Monocytes Absolute 0.8 0.1 - 1.0 K/uL  ? Eosinophils Relative 1 %  ? Eosinophils Absolute 0.1 0.0 - 0.5 K/uL  ? Basophils Relative 0 %  ? Basophils Absolute 0.0 0.0 - 0.1 K/uL  ? Immature Granulocytes 1 %  ? Abs Immature Granulocytes 0.05 0.00 - 0.07 K/uL  ?Basic metabolic panel     Status: Abnormal  ? Collection Time: 04/19/21  5:46 PM  ?Result Value Ref Range  ? Sodium 137 135 - 145 mmol/L  ? Potassium 3.5 3.5 - 5.1 mmol/L  ? Chloride 109 98 - 111 mmol/L  ? CO2 19 (L) 22 - 32 mmol/L  ? Glucose, Bld 85 70 - 99 mg/dL  ? BUN 9 6 - 20 mg/dL  ?  Creatinine, Ser 0.50 0.44 - 1.00 mg/dL  ? Calcium 8.8 (L) 8.9 -  10.3 mg/dL  ? GFR, Estimated >60 >60 mL/min  ? Anion gap 9 5 - 15  ?Wet prep, genital     Status: Abnormal  ? Collection Time: 04/19/21  6:10 PM  ?Result Value Ref Range  ? Yeast Wet Prep HPF POC NONE SEEN NONE SEEN  ? Trich, Wet Prep NONE SEEN NONE SEEN  ? Clue Cells Wet Prep HPF POC PRESENT (A) NONE SEEN  ? WBC, Wet Prep HPF POC >=10 (A) <10  ? Sperm NONE SEEN   ? ? ?MDM ?UA, UC ?Consulted with Dr. 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 Recommends CBC with Diff and BMP ? ?Results reviewed with MD- given patient has no insurance, will coordinate with health department for further follow up and possible imaging. Labs stable and no signs of infection at this time. Will start prophylactic keflex.  ? ?Assessment and Plan  ? ?1. Urethral diverticulum   ?2. [redacted] weeks gestation of pregnancy   ? ?-Discharge home in stable condition ?-Rx for keflex sent to patient's pharmacy ?-UTI precautions discussed ?-Patient advised to follow-up with OB as scheduled for prenatal care ?-Patient may return to MAU as needed or if her condition were to change or worsen ? ? ?Rolm Bookbinder CNM ?04/19/2021, 4:52 PM  ?

## 2021-04-19 NOTE — MAU Note (Signed)
Laila Jarzabek is a 31 y.o. at [redacted]w[redacted]d here in MAU reporting: feeling a pain on left groins when she walks.  When she is laying down, it is worse.  Feels something in her vagina-a 'ball', when she cleans. first noted 3 wks ago. Now when she sneezes, she leaks urine.  Feels pressure.  On Sunday she had back pain like when she had kidney infection with last preg, pain was jus on Sunday. No bleeding, no LOF.  +FM.  ?Onset of complaint: 3 wks ago ?Pain score: 8 ?Vitals:  ? 04/19/21 1623  ?BP: 115/80  ?Pulse: 89  ?Resp: 18  ?Temp: 97.9 ?F (36.6 ?C)  ?SpO2: 99%  ?   ?FHT:142 ?Lab orders placed from triage:   ?

## 2021-04-19 NOTE — Discharge Instructions (Signed)

## 2021-04-20 LAB — GC/CHLAMYDIA PROBE AMP (~~LOC~~) NOT AT ARMC
Chlamydia: POSITIVE — AB
Comment: NEGATIVE
Comment: NORMAL
Neisseria Gonorrhea: NEGATIVE

## 2021-04-21 LAB — CULTURE, OB URINE

## 2021-05-08 ENCOUNTER — Telehealth (HOSPITAL_COMMUNITY): Payer: Self-pay | Admitting: *Deleted

## 2021-05-08 NOTE — Telephone Encounter (Signed)
Preadmission screen  

## 2021-05-09 ENCOUNTER — Telehealth (HOSPITAL_COMMUNITY): Payer: Self-pay | Admitting: *Deleted

## 2021-05-09 ENCOUNTER — Encounter (HOSPITAL_COMMUNITY): Payer: Self-pay | Admitting: *Deleted

## 2021-05-09 NOTE — Telephone Encounter (Signed)
Preadmission screen  

## 2021-05-10 ENCOUNTER — Encounter (HOSPITAL_COMMUNITY): Payer: Self-pay | Admitting: Obstetrics & Gynecology

## 2021-05-10 ENCOUNTER — Inpatient Hospital Stay (HOSPITAL_COMMUNITY): Payer: Medicaid Other | Admitting: Anesthesiology

## 2021-05-10 ENCOUNTER — Inpatient Hospital Stay (HOSPITAL_COMMUNITY)
Admission: AD | Admit: 2021-05-10 | Discharge: 2021-05-11 | DRG: 807 | Disposition: A | Discharge status: Home / Self Care | Payer: Medicaid Other | Attending: Obstetrics and Gynecology | Admitting: Obstetrics and Gynecology

## 2021-05-10 DIAGNOSIS — O480 Post-term pregnancy: Principal | ICD-10-CM | POA: Diagnosis present

## 2021-05-10 DIAGNOSIS — O479 False labor, unspecified: Principal | ICD-10-CM | POA: Diagnosis present

## 2021-05-10 DIAGNOSIS — O99824 Streptococcus B carrier state complicating childbirth: Secondary | ICD-10-CM | POA: Diagnosis present

## 2021-05-10 DIAGNOSIS — Z8616 Personal history of COVID-19: Secondary | ICD-10-CM | POA: Diagnosis not present

## 2021-05-10 DIAGNOSIS — Z3A40 40 weeks gestation of pregnancy: Secondary | ICD-10-CM

## 2021-05-10 DIAGNOSIS — O99820 Streptococcus B carrier state complicating pregnancy: Secondary | ICD-10-CM

## 2021-05-10 LAB — TYPE AND SCREEN
ABO/RH(D): O POS
Antibody Screen: NEGATIVE

## 2021-05-10 LAB — CBC
HCT: 39.6 % (ref 36.0–46.0)
Hemoglobin: 13 g/dL (ref 12.0–15.0)
MCH: 27.6 pg (ref 26.0–34.0)
MCHC: 32.8 g/dL (ref 30.0–36.0)
MCV: 84.1 fL (ref 80.0–100.0)
Platelets: 262 10*3/uL (ref 150–400)
RBC: 4.71 MIL/uL (ref 3.87–5.11)
RDW: 14.2 % (ref 11.5–15.5)
WBC: 10.3 10*3/uL (ref 4.0–10.5)
nRBC: 0 % (ref 0.0–0.2)

## 2021-05-10 LAB — RPR: RPR Ser Ql: NONREACTIVE

## 2021-05-10 MED ORDER — WITCH HAZEL-GLYCERIN EX PADS: 1.0000 "application " | MEDICATED_PAD | CUTANEOUS | Status: DC | PRN

## 2021-05-10 MED ORDER — PHENYLEPHRINE 40 MCG/ML (10ML) SYRINGE FOR IV PUSH (FOR BLOOD PRESSURE SUPPORT): 80.0000 ug | PREFILLED_SYRINGE | INTRAVENOUS | Status: DC | PRN

## 2021-05-10 MED ORDER — DIBUCAINE (PERIANAL) 1 % EX OINT: 1.0000 "application " | TOPICAL_OINTMENT | CUTANEOUS | Status: DC | PRN

## 2021-05-10 MED ORDER — SODIUM CHLORIDE 0.9 % IV SOLN: 1.0000 g | INTRAVENOUS | Status: DC

## 2021-05-10 MED ORDER — LACTATED RINGERS IV SOLN
INTRAVENOUS | Status: DC
Start: 2021-05-10 — End: 2021-05-10

## 2021-05-10 MED ORDER — ONDANSETRON HCL 4 MG/2ML IJ SOLN: 4.0000 mg | Freq: Four times a day (QID) | INTRAMUSCULAR | Status: DC | PRN

## 2021-05-10 MED ORDER — LACTATED RINGERS IV SOLN: 500.0000 mL | INTRAVENOUS | Status: DC | PRN

## 2021-05-10 MED ORDER — LIDOCAINE HCL (PF) 1 % IJ SOLN: 30.0000 mL | INTRAMUSCULAR | Status: DC | PRN

## 2021-05-10 MED ORDER — OXYCODONE-ACETAMINOPHEN 5-325 MG PO TABS: 1.0000 | ORAL_TABLET | ORAL | Status: DC | PRN

## 2021-05-10 MED ORDER — DIPHENHYDRAMINE HCL 25 MG PO CAPS: 25.0000 mg | ORAL_CAPSULE | Freq: Four times a day (QID) | ORAL | Status: DC | PRN

## 2021-05-10 MED ORDER — PRENATAL MULTIVITAMIN CH
1.0000 | ORAL_TABLET | Freq: Every day | ORAL | Status: DC
  Administered 2021-05-11: 1 via ORAL
  Filled 2021-05-10: qty 1

## 2021-05-10 MED ORDER — ONDANSETRON HCL 4 MG PO TABS: 4.0000 mg | ORAL_TABLET | ORAL | Status: DC | PRN

## 2021-05-10 MED ORDER — DIPHENHYDRAMINE HCL 50 MG/ML IJ SOLN: 12.5000 mg | INTRAMUSCULAR | Status: DC | PRN

## 2021-05-10 MED ORDER — SENNOSIDES-DOCUSATE SODIUM 8.6-50 MG PO TABS
2.0000 | ORAL_TABLET | Freq: Every day | ORAL | Status: DC
  Administered 2021-05-11: 2 via ORAL
  Filled 2021-05-10: qty 2

## 2021-05-10 MED ORDER — IBUPROFEN 600 MG PO TABS
600.0000 mg | ORAL_TABLET | Freq: Four times a day (QID) | ORAL | Status: DC
  Administered 2021-05-10 – 2021-05-11 (×4): 600 mg via ORAL
  Filled 2021-05-10 (×4): qty 1

## 2021-05-10 MED ORDER — EPHEDRINE 5 MG/ML INJ: 10.0000 mg | INTRAVENOUS | Status: DC | PRN

## 2021-05-10 MED ORDER — SODIUM CHLORIDE 0.9 % IV SOLN
2.0000 g | Freq: Once | INTRAVENOUS | Status: AC
  Administered 2021-05-10: 2 g via INTRAVENOUS
  Filled 2021-05-10: qty 2000

## 2021-05-10 MED ORDER — SOD CITRATE-CITRIC ACID 500-334 MG/5ML PO SOLN: 30.0000 mL | ORAL | Status: DC | PRN

## 2021-05-10 MED ORDER — ACETAMINOPHEN 325 MG PO TABS
650.0000 mg | ORAL_TABLET | ORAL | Status: DC | PRN
Start: 2021-05-10 — End: 2021-05-11

## 2021-05-10 MED ORDER — OXYCODONE-ACETAMINOPHEN 5-325 MG PO TABS: 2.0000 | ORAL_TABLET | ORAL | Status: DC | PRN

## 2021-05-10 MED ORDER — BENZOCAINE-MENTHOL 20-0.5 % EX AERO: 1.0000 "application " | INHALATION_SPRAY | CUTANEOUS | Status: DC | PRN

## 2021-05-10 MED ORDER — SODIUM BICARBONATE 8.4 % IV SOLN
INTRAVENOUS | Status: DC | PRN
  Administered 2021-05-10: 6 mL via EPIDURAL

## 2021-05-10 MED ORDER — FENTANYL-BUPIVACAINE-NACL 0.5-0.125-0.9 MG/250ML-% EP SOLN
12.0000 mL/h | EPIDURAL | Status: DC | PRN
  Administered 2021-05-10: 12 mL/h via EPIDURAL
  Filled 2021-05-10: qty 250

## 2021-05-10 MED ORDER — OXYTOCIN-SODIUM CHLORIDE 30-0.9 UT/500ML-% IV SOLN
2.5000 [IU]/h | INTRAVENOUS | Status: DC
  Filled 2021-05-10: qty 500

## 2021-05-10 MED ORDER — ACETAMINOPHEN 325 MG PO TABS: 650.0000 mg | ORAL_TABLET | ORAL | Status: DC | PRN

## 2021-05-10 MED ORDER — TETANUS-DIPHTH-ACELL PERTUSSIS 5-2.5-18.5 LF-MCG/0.5 IM SUSY: 0.5000 mL | PREFILLED_SYRINGE | Freq: Once | INTRAMUSCULAR | Status: DC

## 2021-05-10 MED ORDER — COCONUT OIL OIL: 1.0000 "application " | TOPICAL_OIL | Status: DC | PRN

## 2021-05-10 MED ORDER — FENTANYL CITRATE (PF) 100 MCG/2ML IJ SOLN
100.0000 ug | Freq: Once | INTRAMUSCULAR | Status: AC
  Administered 2021-05-10: 100 ug via INTRAVENOUS

## 2021-05-10 MED ORDER — SIMETHICONE 80 MG PO CHEW: 80.0000 mg | CHEWABLE_TABLET | ORAL | Status: DC | PRN

## 2021-05-10 MED ORDER — FENTANYL CITRATE (PF) 100 MCG/2ML IJ SOLN
INTRAMUSCULAR | Status: AC
  Filled 2021-05-10: qty 2

## 2021-05-10 MED ORDER — OXYTOCIN BOLUS FROM INFUSION
333.0000 mL | Freq: Once | INTRAVENOUS | Status: AC
  Administered 2021-05-10: 333 mL via INTRAVENOUS

## 2021-05-10 MED ORDER — ONDANSETRON HCL 4 MG/2ML IJ SOLN: 4.0000 mg | INTRAMUSCULAR | Status: DC | PRN

## 2021-05-10 MED ORDER — LACTATED RINGERS IV SOLN: 500.0000 mL | Freq: Once | INTRAVENOUS | Status: DC

## 2021-05-10 NOTE — Progress Notes (Addendum)
Delivery Note ?Called to room and patient was complete and pushing. Head delivered ROA. Nuchal cord present, loose and easily removed. Shoulder and body delivered in usual fashion with right arm up. At  ?1310 a viable and healthy female was delivered via Vaginal, Spontaneous (Presentation: ROA).  Infant with spontaneous cry, placed on mother's abdomen, dried and stimulated. Cord clamped x 2 after 2-minute delay, and cut by father of baby. Cord blood drawn. Placenta delivered spontaneously with gentle cord traction. Appears intact. Fundus firm with massage and Pitocin. Labia, perineum, vagina, and cervix inspected, found to have a 1st degree tear that did not achieve hemostasis with pressure.    ? ? ?APGAR: 8, 9  ?Cord: 3VC with no complications   ? ?Anesthesia:   ?Episiotomy: None ?Lacerations: 1st degree  ?Suture Repair: 3.0 vicryl ?Est. Blood Loss (mL): 300 ? ?Mom to postpartum.  Baby to Couplet care / Skin to Skin. ? ?Noralee Stain, DO, PGY-1 ?05/10/21 ?1:47 PM    ? ?I attest that I was gowned and gloved for the entirety of this delivery and repair.  ? ?Starr Lake, CNM ?[facility] ?05/10/2021 3:33 PM ? ?

## 2021-05-10 NOTE — Anesthesia Preprocedure Evaluation (Signed)
Anesthesia Evaluation  ?Patient identified by MRN, date of birth, ID band ?Patient awake ? ? ? ?Reviewed: ?Allergy & Precautions, NPO status , Patient's Chart, lab work & pertinent test results ? ?Airway ?Mallampati: II ? ?TM Distance: >3 FB ?Neck ROM: Full ? ? ? Dental ?no notable dental hx. ? ?  ?Pulmonary ?neg pulmonary ROS,  ?  ?Pulmonary exam normal ?breath sounds clear to auscultation ? ? ? ? ? ? Cardiovascular ?Exercise Tolerance: Good ?negative cardio ROS ?Normal cardiovascular exam ?Rhythm:Regular Rate:Normal ? ? ?  ?Neuro/Psych ?negative neurological ROS ? negative psych ROS  ? GI/Hepatic ?negative GI ROS, Neg liver ROS,   ?Endo/Other  ?negative endocrine ROS ? Renal/GU ?negative Renal ROS  ?negative genitourinary ?  ?Musculoskeletal ?negative musculoskeletal ROS ?(+)  ? Abdominal ?  ?Peds ?negative pediatric ROS ?(+)  Hematology ?negative hematology ROS ?(+)   ?Anesthesia Other Findings ? ? Reproductive/Obstetrics ?(+) Pregnancy ? ?  ? ? ? ? ? ? ? ? ? ? ? ? ? ?  ?  ? ? ? ? ? ? ? ? ?Anesthesia Physical ?Anesthesia Plan ? ?ASA: 2 ? ?Anesthesia Plan: Epidural  ? ?Post-op Pain Management: Minimal or no pain anticipated  ? ?Induction:  ? ?PONV Risk Score and Plan: 2 and Treatment may vary due to age or medical condition ? ?Airway Management Planned: Natural Airway ? ?Additional Equipment: None ? ?Intra-op Plan:  ? ?Post-operative Plan:  ? ?Informed Consent: I have reviewed the patients History and Physical, chart, labs and discussed the procedure including the risks, benefits and alternatives for the proposed anesthesia with the patient or authorized representative who has indicated his/her understanding and acceptance.  ? ? ? ? ? ?Plan Discussed with: Anesthesiologist ? ?Anesthesia Plan Comments:   ? ? ? ? ? ? ?Anesthesia Quick Evaluation ? ?

## 2021-05-10 NOTE — H&P (Addendum)
OBSTETRIC ADMISSION HISTORY AND PHYSICAL ? ?Amber Murphy is a 31 y.o. female [phone] with IUP at [redacted]w[redacted]d by LMP presenting for SOL. She reports +FMs, No LOF, no VB, no blurry vision, headaches or peripheral edema, and RUQ pain.  She plans on breast feeding. She request Depo for birth control. ?She received her prenatal care at [facility]  ? ?Dating: By LMP --->  Estimated Date of Delivery: 05/05/21 ? ?Sono:   ? ?@[redacted]w[redacted]d , CWD, normal anatomy, posterior placental lie, 26% EFW ? ? ?Prenatal History/Complications:  ?-- chlamydial infection during pregnancy, with test of cure negative  ?-- urethral diverticulum  ? ?Past Medical History: ?Past Medical History:  ?Diagnosis Date  ? Medical history non-contributory   ? No pertinent past medical history   ? ? ?Past Surgical History: ?Past Surgical History:  ?Procedure Laterality Date  ? NO PAST SURGERIES    ? ? ?Obstetrical History: ?OB History   ? ? Gravida  ?5  ? Para  ?2  ? Term  ?2  ? Preterm  ?0  ? AB  ?1  ? Living  ?2  ?  ? ? SAB  ?1  ? IAB  ?0  ? Ectopic  ?0  ? Multiple  ?0  ? Live Births  ?2  ?   ?  ?  ? ? ?Social History ?Social History  ? ?Socioeconomic History  ? Marital status: Single  ?  Spouse name: Not on file  ? Number of children: Not on file  ? Years of education: Not on file  ? Highest education level: Not on file  ?Occupational History  ? Not on file  ?Tobacco Use  ? Smoking status: Never  ? Smokeless tobacco: Never  ?Vaping Use  ? Vaping Use: Never used  ?Substance and Sexual Activity  ? Alcohol use: No  ? Drug use: No  ? Sexual activity: Yes  ?  Birth control/protection: None  ?Other Topics Concern  ? Not on file  ?Social History Narrative  ? ** Merged History Encounter **  ?    ? ?Social Determinants of Health  ? ?Financial Resource Strain: Not on file  ?Food Insecurity: Not on file  ?Transportation Needs: Not on file  ?Physical Activity: Not on file  ?Stress: Not on file  ?Social Connections: Not on file  ? ? ?Family History: ?Family History  ?Problem  Relation Age of Onset  ? Early death Father   ? ? ?Allergies: ?No Known Allergies ? ?Medications Prior to Admission  ?Medication Sig Dispense Refill Last Dose  ? cephALEXin (KEFLEX) 500 MG capsule Take 1 capsule (500 mg total) by mouth daily. 30 capsule 0 05/09/2021  ? Prenatal MV-Min-Fe Fum-FA-DHA ([employer] WOMENS PRENATAL+DHA) 28-0.975 & 200 MG MISC Take 1 tablet by mouth daily. 60 each 0 05/09/2021  ? metoCLOPramide (REGLAN) 10 MG tablet Take 1 tablet (10 mg total) by mouth every 6 (six) hours. (Patient not taking: Reported on 10/31/2020) 30 tablet 0   ? ondansetron (ZOFRAN ODT) 4 MG disintegrating tablet Take 1-2 tablets (4-8 mg total) by mouth every 6 (six) hours as needed for nausea or vomiting. (Patient not taking: Reported on 10/31/2020) 20 tablet 0   ? ? ? ?Review of Systems  ? ?All systems reviewed and negative except as stated in HPI ? ?Blood pressure 97/67, pulse 95, temperature 98 ?F (36.7 ?C), resp. rate 18, last menstrual period 07/29/2020, unknown if currently breastfeeding. ?General appearance: alert, cooperative, and no distress ?Lungs: normal work of breathing  ?Abdomen: soft, non-tender ?  Extremities: Homans sign is negative, no sign of DVT ?Presentation: cephalic ?Fetal monitoringBaseline: 140 bpm, Variability: Good {> 6 bpm), and Accelerations: Reactive ?Uterine activityDate/time of onset: 5am ?Dilation: 7 ?Station: -2 ?Exam by:: jolynn ? ? ?Prenatal labs: ?ABO, Rh: --/--/O POS (04/12 9179) ?Antibody: NEG (04/12 1505) ?Rubella: Immune (09/12 0000) ?RPR: Nonreactive (09/12 0000)  ?HBsAg: Negative (09/12 0000)  ?HIV: Non-reactive (09/12 0000)  ?GBS: Positive/-- (09/12 0000)  ?3 hr GTT normal  ?Genetic screening  normal  ?Anatomy US normal ? ?Prenatal Transfer Tool  ?Maternal Diabetes: No ?Genetic Screening: Normal ?Maternal Ultrasounds/Referrals: Normal ?Fetal Ultrasounds or other Referrals:  None ?Maternal Substance Abuse:  No ?Significant Maternal Medications:  None ?Significant Maternal Lab Results:  Group B Strep positive ? ?Results for orders placed or performed during the hospital encounter of 05/10/21 (from the past 24 hour(s))  ?CBC  ? Collection Time: 05/10/21  8:11 AM  ?Result Value Ref Range  ? WBC 10.3 4.0 - 10.5 K/uL  ? RBC 4.71 3.87 - 5.11 MIL/uL  ? Hemoglobin 13.0 12.0 - 15.0 g/dL  ? HCT 39.6 36.0 - 46.0 %  ? MCV 84.1 80.0 - 100.0 fL  ? MCH 27.6 26.0 - 34.0 pg  ? MCHC 32.8 30.0 - 36.0 g/dL  ? RDW 14.2 11.5 - 15.5 %  ? Platelets 262 150 - 400 K/uL  ? nRBC 0.0 0.0 - 0.2 %  ?Type and screen [facility]  ? Collection Time: 05/10/21  8:14 AM  ?Result Value Ref Range  ? ABO/RH(D) O POS   ? Antibody Screen NEG   ? Sample Expiration    ?  05/13/2021,2359 ?Performed at [facility], [address] [place], [place] [address] ?  ? ? ?Patient Active Problem List  ? Diagnosis Date Noted  ? Uterine contractions 05/10/2021  ? Urethral diverticulum 04/19/2021  ? COVID-19 01/19/2019  ? ? ?Assessment/Plan:  ?Amber Murphy is a 31 y.o. [id number] at [redacted]w[redacted]d here for SOL.   ? ?#Labor: in active labor, expectant management ?#Pain: Epidural placed ?#FWB: Cat 1 ?#ID:  GBS +, amp given  ?#MOF: breast ?#MOC:Depo ?#Circ:  Unsure  ? ?Derenda Fennel, DO, PGY-1 ?05/10/2021, 10:27 AM ? ?  ?Attestation of Supervision of Student:  I confirm that I have verified the information documented in the  resident  student?s note and that I have also personally reperformed the history, physical exam and all medical decision making activities.  I have verified that all services and findings are accurately documented in this student's note; and I agree with management and plan as outlined in the documentation. I have also made any necessary editorial changes. ? ?-GC TOC was negative on 04/06 ? ?Marylene Land, CNM ?[facility] [employer], [facility] ?05/10/2021 12:19 PM ? ? ?

## 2021-05-10 NOTE — Discharge Summary (Signed)
? ?  Postpartum Discharge Summary ? ? ?   ?Patient Name: Amber Murphy ?DOB: 05/06/1990 ?MRN: [id number] ? ?Date of admission: 05/10/2021 ?Delivery date:05/10/2021  ?Delivering provider: SIMMONS-JOSILEVICH, JESSICA MARIE  ?Date of discharge: 05/11/2021 ? ?Admitting diagnosis: Uterine contractions [O47.9] ?Intrauterine pregnancy: [redacted]w[redacted]d    ?Secondary diagnosis:  Principal Problem: ?  Uterine contractions ? ?Additional problems: none    ?Discharge diagnosis: Term Pregnancy Delivered                                              ?Post partum procedures: None ?Augmentation: AROM ?Complications: None ? ?Hospital course: Onset of Labor With Vaginal Delivery      ?31y.o. yo [id number] 461w5das admitted in Active Labor on 05/10/2021. Patient had an uncomplicated labor course as follows:  ?Membrane Rupture Time/Date: 12:16 PM ,05/10/2021   ?Delivery Method:Vaginal, Spontaneous  ?Episiotomy: None  ?Lacerations:  1st degree  ?Patient had an uncomplicated postpartum course.  She is ambulating, tolerating a regular diet, passing flatus, and urinating well. Patient is discharged home in stable condition on 05/11/21. ? ?Newborn Data: ?Birth date:05/10/2021  ?Birth time:1:10 PM  ?Gender:Female  ?Living status:Living  ?Apgars:8 ,9  ?Weight:3360 g  ? ?Magnesium Sulfate received: No ?BMZ received: No ?Rhophylac:No ?MMR:N/A ?T-DaP:Given prenatally ?Flu: N/A ?Transfusion:No ? ?Physical exam  ?Vitals:  ? 05/10/21 1550 05/10/21 1953 05/10/21 2351 05/11/21 0350  ?BP: 102/74 1_0  ?Pulse: 70 81 80 73  ?Resp: _1 ?Temp: 97.7 ?F (36.5 ?C) 98.2 ?F (36.8 ?C) 98.2 ?F (36.8 ?C) 98.4 ?F (36.9 ?C)  ?TempSrc:  Oral Oral Oral  ?SpO2: 100% 97% 98% 97%  ? ?General: alert, cooperative, and no distress ?Lochia: appropriate ?Uterine Fundus: firm ?Incision: N/A ?DVT Evaluation: No evidence of DVT seen on physical exam. ?Negative Homan's sign. ?No cords or calf tenderness. ?Labs: ?Lab Results  ?Component Value Date  ? WBC 10.3  05/10/2021  ? HGB 13.0 05/10/2021  ? HCT 39.6 05/10/2021  ? MCV 84.1 05/10/2021  ? PLT 262 05/10/2021  ? ? ?  Latest Ref Rng & Units 04/19/2021  ?  5:46 PM  ?CMP  ?Glucose 70 - 99 mg/dL 85    ?BUN 6 - 20 mg/dL 9    ?Creatinine 0.44 - 1.00 mg/dL 0.50    ?Sodium 135 - 145 mmol/L 137    ?Potassium 3.5 - 5.1 mmol/L 3.5    ?Chloride 98 - 111 mmol/L 109    ?CO2 22 - 32 mmol/L 19    ?Calcium 8.9 - 10.3 mg/dL 8.8    ? ?Edinburgh Score: ? ?  05/10/2021  ?  2:51 PM  ?Edinburgh Postnatal Depression Scale Screening Tool  ?I have been able to laugh and see the funny side of things. 0  ?I have looked forward with enjoyment to things. 0  ?I have blamed myself unnecessarily when things went wrong. 1  ?I have been anxious or worried for no good reason. 0  ?I have felt scared or panicky for no good reason. 0  ?Things have been getting on top of me. 0  ?I have been so unhappy that I have had difficulty sleeping. 0  ?I have felt sad or miserable. 0  ?I have been so unhappy that I have been crying. 0  ?The thought of harming myself has occurred to me. 0  ?EdFlavia Shipperostnatal  Depression Scale Total 1  ? ? ? ?After visit meds:  ?Allergies as of 05/11/2021   ?No Known Allergies ?  ? ?  ?Medication List  ?  ? ?STOP taking these medications   ? ?cephALEXin 500 MG capsule ?Commonly known as: KEFLEX ?  ?metoCLOPramide 10 MG tablet ?Commonly known as: REGLAN ?  ?ondansetron 4 MG disintegrating tablet ?Commonly known as: Zofran ODT ?  ? ?  ? ?TAKE these medications   ? ?[employer] Womens Prenatal+DHA 28-0.975 & 200 MG Misc ?Take 1 tablet by mouth daily. ?  ?ibuprofen 600 MG tablet ?Commonly known as: ADVIL ?Take 1 tablet (600 mg total) by mouth every 6 (six) hours. ?  ? ?  ? ? ? ?Discharge home in stable condition ?Infant Feeding: Bottle and Breast ?Infant Disposition:home with mother ?Discharge instruction: per After Visit Summary and Postpartum booklet. ?Activity: Advance as tolerated. Pelvic rest for 6 weeks.  ?Diet: routine diet ?Future  Appointments: ?Future Appointments  ?Date Time Provider [place]  ?08/29/2021  4:20 PM Jaquita Folds, MD [facility]  ? ?Follow up Visit: ? Follow-up Information   ? ? Department, [facility]. Schedule an appointment as soon as possible for a visit in 6 week(s).   ?Why: for postpartum check up/depo ?Contact information: ?[place] ?[place] [address] ?[phone] ? ? ?  ?  ? ?  ?  ? ?  ? ? ?No follow up message sent; patient to make appt at [facility] on her own ? ?Please schedule this patient for a In person postpartum visit in 4 weeks with the following provider: Any provider. ?Additional Postpartum F/U: None   ?Low risk pregnancy complicated by:  nothing ?Delivery mode:  Vaginal, Spontaneous  ?Anticipated Birth Control:  Depo ? ? ?05/11/2021 ?Christin Fudge, CNM ? ? ? ?

## 2021-05-10 NOTE — MAU Note (Signed)
.  Amber Murphy is a 31 y.o. at 103w5d here in MAU reporting: ctx since 5am. Closer and more uncomfortable now. Deneis any vag bleeding or leaking . Good etal movement felt.  ? ?Onset of complaint: 5am ?Pain score: 10 ?Vitals:  ? 05/10/21 0752  ?BP: 121/88  ?Pulse: 83  ?Resp: 18  ?Temp: 98 ?F (36.7 ?C)  ?   ?FHT:130 ?Lab orders placed from triage:  . ? ?

## 2021-05-10 NOTE — Lactation Note (Signed)
This note was copied from a baby's chart. ?Lactation Consultation Note ? ?Patient Name: Amber Murphy ?Today's Date: 05/10/2021 ?Reason for consult: L&D Initial assessment;Term;Breastfeeding assistance;Other (Comment) (LC LD visit at PP, per mom baby had been latched since 1:40 / its been 20 mins . LC was able to observe the feeding for 8 of the 20 mins with few swallows. per mom mentioned she had been leaking. P 3 ( attempted w/ 1st 2 )  Mom aware LC will F/U.) ?Age:71 mins PP ? ? ?Maternal Data - per mom attempted with her 1st 2 and has been leaking.  ?  ? ?Feeding ?Mother's Current Feeding Choice: Breast Milk ? ?LATCH Score ?Latch:  (baby latched with depth) ? ?Audible Swallowing:  (swallows noted) ? ?Type of Nipple:  (nipple well rounded when the baby released) ? ?  ? ?Hold (Positioning):  (mom latched the baby) ? ?  ? ? ?Lactation Tools Discussed/Used ?  ? ?Interventions ?Interventions: Breast feeding basics reviewed;Breast compression;Education ? ?Discharge ?  ? ?Consult Status ?Consult Status: Follow-up from L&D ?Date: 05/10/21 ?Follow-up type: In-patient ? ? ? ?Matilde Sprang Chavie Kolinski ?05/10/2021, 2:13 PM ? ? ? ?

## 2021-05-10 NOTE — Progress Notes (Signed)
? ?  Amber Murphy is a 31 y.o. [id number] at [redacted]w[redacted]d  admitted for active labor ? ?Subjective: ?Coping well; epidural in place ? ?Objective: ?Vitals:  ? 05/10/21 1031 05/10/21 1101 05/10/21 1131 05/10/21 1201  ?BP: 105/70 99/72 101/75 107/76  ?Pulse: 66 70 66 70  ?Resp:      ?Temp:      ? ?No intake/output data recorded. ? ?FHT:  FHR: 130 bpm, variability: moderate,  accelerations:  Present,  decelerations:  Absent ?UC:   irregular, every 1-3 minutes ?SVE:   Dilation: 9 ?Station: -1 ?Exam by:: Amber Murphy ?Pitocin @ 0 mu/min ? ?Labs: ?Lab Results  ?Component Value Date  ? WBC 10.3 05/10/2021  ? HGB 13.0 05/10/2021  ? HCT 39.6 05/10/2021  ? MCV 84.1 05/10/2021  ? PLT 262 05/10/2021  ? ? ?Assessment / Plan: ?Spontaneous labor, progressing normally ?AROm clear fluid at 1200 ?Labor: Progressing normally ?Fetal Wellbeing:  Category I ?Pain Control:  Epidural ?Anticipated MOD:  NSVD ? ?Amber Murphy ?05/10/2021, 12:20 PM ? ? ? ? ?

## 2021-05-10 NOTE — Anesthesia Procedure Notes (Signed)
Epidural ?Patient location during procedure: OB ?Start time: 05/10/2021 9:03 AM ?End time: 05/10/2021 9:13 AM ? ?Staffing ?Anesthesiologist: Mellody Dance, MD ?Performed: anesthesiologist  ? ?Preanesthetic Checklist ?Completed: patient identified, IV checked, site marked, risks and benefits discussed, monitors and equipment checked, pre-op evaluation and timeout performed ? ?Epidural ?Patient position: sitting ?Prep: DuraPrep ?Patient monitoring: heart rate, cardiac monitor, continuous pulse ox and blood pressure ?Approach: midline ?Location: L3-L4 ?Injection technique: LOR saline ? ?Needle:  ?Needle type: Tuohy  ?Needle gauge: 17 G ?Needle length: 9 cm ?Needle insertion depth: 4 cm ?Catheter type: closed end flexible ?Catheter size: 20 Guage ?Catheter at skin depth: 9 cm ?Test dose: negative and Other ? ?Assessment ?Events: blood not aspirated, injection not painful, no injection resistance and negative IV test ? ?Additional Notes ?Informed consent obtained prior to proceeding including risk of failure, 1% risk of PDPH, risk of minor discomfort and bruising.  Discussed rare but serious complications including epidural abscess, permanent nerve injury, epidural hematoma.  Discussed alternatives to epidural analgesia and patient desires to proceed.  Timeout performed pre-procedure verifying patient name, procedure, and platelet count.  Patient tolerated procedure well. ? ? ? ? ?

## 2021-05-11 MED ORDER — IBUPROFEN 600 MG PO TABS
600.0000 mg | ORAL_TABLET | Freq: Four times a day (QID) | ORAL | 0 refills | Status: DC
Start: 2021-05-11 — End: 2021-07-23

## 2021-05-11 NOTE — Lactation Note (Signed)
This note was copied from a baby's chart. ?Lactation Consultation Note ?Mom has been BF for a while. Mom doesn't feel like she has any milk for the baby. ?Mom is unable to hand express any colostrum. LC attempted to hand express and none noted at this time. ?Mom wanted to pump to get the milk out. Explained she can for for stimulation but don't get upset if nothing comes out, it's normal for nothing to come out. ?Mom didn't BF her 5 and 32 yr old daughters. ?Mom encouraged to feed baby 8-12 times/24 hours and with feeding cues.  Mom shown how to use DEBP & how to disassemble, clean, & reassemble parts. Mom knows to pump q3h for 15-20 min.  ?Encouraged STS and I&O. ?Mom wanted to give formula until she gets milk. ?Encouraged to BF before giving formula.  ?Baby has a lot of bruising to face. Reviewed jaundice and how it can make baby sleepy making feedings difficult sometimes. ?Discussed ways to wake baby for feeding time. ?FOB gave formula while mom pumped. ?FOB is mom's interpreter. FOB discussed his daughters.  ?Encouraged mom to rest when baby rest. ?Encouraged to call for assistance or questions. ? ?Patient Name: Amber Murphy ?Today's Date: 05/11/2021 ?Reason for consult: Initial assessment;Term ?Age:27 hours ? ?Maternal Data ?Has patient been taught Hand Expression?: Yes ?Does the patient have breastfeeding experience prior to this delivery?: No ? ?Feeding ?Nipple Type: Slow - flow ? ?LATCH Score ?Latch: Grasps breast easily, tongue down, lips flanged, rhythmical sucking. ? ?Audible Swallowing: None ? ?Type of Nipple: Everted at rest and after stimulation ? ?Comfort (Breast/Nipple): Soft / non-tender ? ?Hold (Positioning): No assistance needed to correctly position infant at breast. ? ?LATCH Score: 8 ? ? ?Lactation Tools Discussed/Used ?Tools: Pump ?Breast pump type: Double-Electric Breast Pump ?Pump Education: Setup, frequency, and cleaning ?Reason for Pumping: mom doesn't think she has any  milk ?Pumping frequency: q 3hr ?Pumped volume: 0 mL ? ?Interventions ?Interventions: Breast feeding basics reviewed;Hand express;Breast massage;DEBP;LC Services brochure ? ?Discharge ?  ? ?Consult Status ?Consult Status: Follow-up ?Date: 05/12/21 ?Follow-up type: In-patient ? ? ? ?[name] ?05/11/2021, 2:30 AM ? ? ? ?

## 2021-05-11 NOTE — Anesthesia Postprocedure Evaluation (Signed)
Anesthesia Post Note ? ?Patient: Amber Murphy ? ?Procedure(s) Performed: AN AD HOC LABOR EPIDURAL ? ?  ? ?Patient location during evaluation: Mother Baby ?Anesthesia Type: Epidural ?Level of consciousness: awake ?Pain management: satisfactory to patient ?Vital Signs Assessment: post-procedure vital signs reviewed and stable ?Respiratory status: spontaneous breathing ?Cardiovascular status: stable ?Anesthetic complications: no ? ? ?No notable events documented. ? ?Last Vitals:  ?Vitals:  ? 05/10/21 2351 05/11/21 0350  ?BP: 98/73 91/61  ?Pulse: 80 73  ?Resp: 18 16  ?Temp: 36.8 ?C 36.9 ?C  ?SpO2: 98% 97%  ?  ?Last Pain:  ?Vitals:  ? 05/11/21 0534  ?TempSrc:   ?PainSc: 0-No pain  ? ?Pain Goal:   ? ?  ?  ?  ?  ?  ?  ?  ? ?Amber Murphy ? ? ? ? ?

## 2021-05-11 NOTE — Procedures (Deleted)
  The note originally documented on this encounter has been moved the the encounter in which it belongs.  

## 2021-05-11 NOTE — Lactation Note (Signed)
This note was copied from a baby's chart. ?Lactation Consultation Note ? ?Patient Name: Amber Murphy ?Today's Date: 05/11/2021 ?Reason for consult: Follow-up assessment;1st time breastfeeding;Term ?Age:32 hours ? ? ?P3 mother whose infant is now 35 hours old.  This is a term baby at 40+5 weeks.  Mother did not breast feed her first two children.  Her current feeding preference is breast/formula. ? ?Father interpreting for mother, however, mother understands some [place]. ? ?Mother desires to breast feed, however, has not been breast feeding consistently.  She stated that she "doesn't have any mild."  Comprehensive amount of education provided related to breast feeding and milk coming to volume.  Mother is able to hand express.  Encouraged lots of hand expression and to latch baby with every feeding prior to giving formula supplementation.  Discussed tummy size and volume supplementation guidelines.  Father present and very supportive with breast feeding; he has researched the benefits.  Mother was set up with the DEBP but is discouraged because she is not "getting anything."  Reinforced the importance of consistently with hand expression. ? ?Encouraged to call the [facility] for latch assistance as needed.  Parents will call the [facility] office now to determine pump eligibility.  RN updated. ? ? ?Maternal Data ?Has patient been taught Hand Expression?: Yes ?Does the patient have breastfeeding experience prior to this delivery?: No ? ?Feeding ?Mother's Current Feeding Choice: Breast Milk and Formula ?Nipple Type: Slow - flow ? ?LATCH Score ?  ? ?  ? ?  ? ?  ? ?  ? ?  ? ? ?Lactation Tools Discussed/Used ?Tools: Pump;Flanges ?Flange Size: 24 ?Breast pump type: Manual;Double-Electric Breast Pump ?Pumping frequency: Every three hours ? ?Interventions ?Interventions: Breast feeding basics reviewed;Education ? ?Discharge ?Discharge Education: Engorgement and breast care ?Pump: DEBP;Manual;Personal (Mother will call [facility]  not to determine eligibility for a DEBP) ?[place] Program: Yes ? ?Consult Status ?Consult Status: Complete ?Date: 05/11/21 ?Follow-up type: Call as needed ? ? ? ?Soundra Lampley R Ranae Casebier ?05/11/2021, 1:02 PM ? ? ? ?

## 2021-05-12 ENCOUNTER — Inpatient Hospital Stay (HOSPITAL_COMMUNITY): Payer: Self-pay

## 2021-05-12 ENCOUNTER — Inpatient Hospital Stay (HOSPITAL_COMMUNITY): Admission: AD | Admit: 2021-05-12 | Payer: Self-pay | Source: Home / Self Care | Admitting: Obstetrics & Gynecology

## 2021-05-20 ENCOUNTER — Telehealth (HOSPITAL_COMMUNITY): Payer: Self-pay

## 2021-05-20 NOTE — Progress Notes (Signed)
Patient states that she does not want to do screening after completing 3 questions. She states "I feel the same way I did in the hospital and I don't need to do this again." ?

## 2021-05-20 NOTE — Telephone Encounter (Signed)
"  I'm good." Patient declines questions or concerns about her healing.  ? ?"He is doing well. Eating and gaining weight. He sleeps in a crib." RN reviewed ABC's of safe sleep with patient. Patient declines any questions or concerns about baby. ? ?EPDS- Patient states that she does not want to do screening after completing 3 questions. She states "I feel the same way I did in the hospital and I don't need to do this again." ? ?RN wanted to ensure patient could understand and asked patient if she needed an interpreter and she declined needing an interpreter.  ? Amber Murphy ?05/20/2021,1118 ?

## 2021-07-22 ENCOUNTER — Emergency Department (HOSPITAL_COMMUNITY)
Admission: EM | Admit: 2021-07-22 | Discharge: 2021-07-23 | Disposition: A | Discharge status: Home / Self Care | Payer: Self-pay | Attending: Emergency Medicine | Admitting: Emergency Medicine

## 2021-07-22 ENCOUNTER — Other Ambulatory Visit: Payer: Self-pay

## 2021-07-22 ENCOUNTER — Encounter (HOSPITAL_COMMUNITY): Payer: Self-pay

## 2021-07-22 DIAGNOSIS — N390 Urinary tract infection, site not specified: Secondary | ICD-10-CM | POA: Insufficient documentation

## 2021-07-22 DIAGNOSIS — R1030 Lower abdominal pain, unspecified: Secondary | ICD-10-CM

## 2021-07-22 LAB — CBC WITH DIFFERENTIAL/PLATELET
Abs Immature Granulocytes: 0.03 10*3/uL (ref 0.00–0.07)
Basophils Absolute: 0 10*3/uL (ref 0.0–0.1)
Basophils Relative: 0 %
Eosinophils Absolute: 0.1 10*3/uL (ref 0.0–0.5)
Eosinophils Relative: 0 %
HCT: 42.2 % (ref 36.0–46.0)
Hemoglobin: 14.4 g/dL (ref 12.0–15.0)
Immature Granulocytes: 0 %
Lymphocytes Relative: 40 %
Lymphs Abs: 4.5 10*3/uL — ABNORMAL HIGH (ref 0.7–4.0)
MCH: 29 pg (ref 26.0–34.0)
MCHC: 34.1 g/dL (ref 30.0–36.0)
MCV: 84.9 fL (ref 80.0–100.0)
Monocytes Absolute: 0.8 10*3/uL (ref 0.1–1.0)
Monocytes Relative: 7 %
Neutro Abs: 5.9 10*3/uL (ref 1.7–7.7)
Neutrophils Relative %: 53 %
Platelets: 300 10*3/uL (ref 150–400)
RBC: 4.97 MIL/uL (ref 3.87–5.11)
RDW: 14.9 % (ref 11.5–15.5)
WBC: 11.3 10*3/uL — ABNORMAL HIGH (ref 4.0–10.5)
nRBC: 0 % (ref 0.0–0.2)

## 2021-07-22 LAB — COMPREHENSIVE METABOLIC PANEL
ALT: 25 U/L (ref 0–44)
AST: 23 U/L (ref 15–41)
Albumin: 4.5 g/dL (ref 3.5–5.0)
Alkaline Phosphatase: 80 U/L (ref 38–126)
Anion gap: 11 (ref 5–15)
BUN: 13 mg/dL (ref 6–20)
CO2: 20 mmol/L — ABNORMAL LOW (ref 22–32)
Calcium: 9.4 mg/dL (ref 8.9–10.3)
Chloride: 109 mmol/L (ref 98–111)
Creatinine, Ser: 0.73 mg/dL (ref 0.44–1.00)
GFR, Estimated: >60 mL/min (ref >60)
Glucose, Bld: 100 mg/dL — ABNORMAL HIGH (ref 70–99)
Potassium: 3.7 mmol/L (ref 3.5–5.1)
Sodium: 140 mmol/L (ref 135–145)
Total Bilirubin: 0.6 mg/dL (ref 0.3–1.2)
Total Protein: 7.9 g/dL (ref 6.5–8.1)

## 2021-07-22 LAB — URINALYSIS, ROUTINE W REFLEX MICROSCOPIC
Bilirubin Urine: NEGATIVE
Glucose, UA: NEGATIVE mg/dL
Hgb urine dipstick: NEGATIVE
Ketones, ur: NEGATIVE mg/dL
Nitrite: NEGATIVE
Protein, ur: NEGATIVE mg/dL
Specific Gravity, Urine: 1.021 (ref 1.005–1.030)
WBC, UA: >50 WBC/hpf — ABNORMAL HIGH (ref 0–5)
pH: 5 (ref 5.0–8.0)

## 2021-07-22 LAB — LIPASE, BLOOD: Lipase: 38 U/L (ref 11–51)

## 2021-07-22 LAB — I-STAT BETA HCG BLOOD, ED (MC, WL, AP ONLY): I-stat hCG, quantitative: <5 m[IU]/mL (ref <5)

## 2021-07-23 ENCOUNTER — Emergency Department (HOSPITAL_COMMUNITY): Payer: Self-pay

## 2021-07-23 MED ORDER — IBUPROFEN 400 MG PO TABS
400.0000 mg | ORAL_TABLET | Freq: Once | ORAL | Status: AC
  Administered 2021-07-23: 400 mg via ORAL
  Filled 2021-07-23: qty 1

## 2021-07-23 MED ORDER — CEPHALEXIN 250 MG PO CAPS
500.0000 mg | ORAL_CAPSULE | Freq: Once | ORAL | Status: AC
  Administered 2021-07-23: 500 mg via ORAL
  Filled 2021-07-23: qty 2

## 2021-07-23 MED ORDER — CEPHALEXIN 500 MG PO CAPS: 500.0000 mg | ORAL_CAPSULE | Freq: Three times a day (TID) | ORAL | 0 refills | Status: DC

## 2021-08-29 ENCOUNTER — Ambulatory Visit (INDEPENDENT_AMBULATORY_CARE_PROVIDER_SITE_OTHER): Payer: Self-pay | Admitting: Obstetrics and Gynecology

## 2021-08-29 ENCOUNTER — Encounter: Payer: Self-pay | Admitting: Obstetrics and Gynecology

## 2021-08-29 ENCOUNTER — Other Ambulatory Visit (HOSPITAL_COMMUNITY)
Admission: RE | Admit: 2021-08-29 | Discharge: 2021-08-29 | Disposition: A | Discharge status: Home / Self Care | Payer: Self-pay | Attending: Obstetrics and Gynecology | Admitting: Obstetrics and Gynecology

## 2021-08-29 VITALS — BP 99/68 | HR 92 | Ht 62.0 in | Wt 167.0 lb

## 2021-08-29 DIAGNOSIS — N3001 Acute cystitis with hematuria: Secondary | ICD-10-CM

## 2021-08-29 DIAGNOSIS — R82998 Other abnormal findings in urine: Secondary | ICD-10-CM

## 2021-08-29 DIAGNOSIS — N8110 Cystocele, unspecified: Secondary | ICD-10-CM

## 2021-08-29 DIAGNOSIS — N361 Urethral diverticulum: Secondary | ICD-10-CM

## 2021-08-29 LAB — URINALYSIS, ROUTINE W REFLEX MICROSCOPIC
Bilirubin Urine: NEGATIVE
Glucose, UA: NEGATIVE mg/dL
Ketones, ur: NEGATIVE mg/dL
Nitrite: NEGATIVE
Protein, ur: 100 mg/dL — AB
Specific Gravity, Urine: 1.026 (ref 1.005–1.030)
WBC, UA: >50 WBC/hpf — ABNORMAL HIGH (ref 0–5)
pH: 5 (ref 5.0–8.0)

## 2021-08-29 MED ORDER — NITROFURANTOIN MONOHYD MACRO 100 MG PO CAPS: 100.0000 mg | ORAL_CAPSULE | Freq: Two times a day (BID) | ORAL | 0 refills | Status: AC

## 2021-08-29 NOTE — Patient Instructions (Addendum)
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 We need an MRI of the pelvis to confirm.

## 2021-08-29 NOTE — Progress Notes (Unsigned)
[facility] New Patient Evaluation and Consultation  Referring Provider: Rolm Bookbinder, CNM PCP: Mirian Mo, MD (Inactive) Date of Service: 08/29/2021  SUBJECTIVE Chief Complaint: New Patient (Initial Visit) Amber Murphy is a 31 y.o. female here for a consult on prolapse./)  History of Present Illness: Amber Murphy is a 31 y.o.  hispanic  female seen in consultation at the request of CNM Cleone Slim for evaluation of urethral diverticulum.    Review of records significant for: Noticed a vaginal bulge when she was pregnant. Also had frequent loss of urine. Had vaginal delivery on 05/10/21  Urinary Symptoms: Leaks urine with cough/ sneeze, laughing, and exercise. Started this last pregnancy.  Leaks 5 time(s) per day.  Pad use: 10 liners/ mini-pads per day.   She is bothered by her UI symptoms.  Day time voids 6.  Nocturia: 6 times per night to void. Voiding dysfunction: she does not empty her bladder well.  does not use a catheter to empty bladder.  When urinating, she feels the need to urinate multiple times in a row and to push on her belly or vagina to empty bladder  UTIs: 2 UTI's in the last year.   Denies history of blood in urine and kidney or bladder stones  Pelvic Organ Prolapse Symptoms:                  She Admits to a feeling of a bulge the vaginal area. It has been present for 6 months.  She Admits to seeing a bulge.  This bulge is bothersome. Has a ball come out more in certain positions.   Bowel Symptom: Bowel movements: 2 time(s) per day Stool consistency: hard Straining: yes.  Splinting: no.  Incomplete evacuation: no.  She Denies accidental bowel leakage / fecal incontinence Bowel regimen: {bowel regimen:24759} Last colonoscopy: Date ***, Results ***  Sexual Function Sexually active: yes.  Pain with sex: Yes, has discomfort due to prolapse, has discomfort due to dryness  Pelvic Pain Denies pelvic  pain   Past Medical History:  Past Medical History:  Diagnosis Date   Medical history non-contributory    No pertinent past medical history      Past Surgical History:   Past Surgical History:  Procedure Laterality Date   NO PAST SURGERIES       Past OB/GYN History: OB History  Gravida Para Term Preterm AB Living  6 3 3  0 2 3  SAB IAB Ectopic Multiple Live Births  2 0 0 0 3    # Outcome Date GA Lbr Len/2nd Weight Sex Delivery Anes PTL Lv  6 Term 05/10/21 [redacted]w[redacted]d 07:57 / 00:13 7 lb 6.5 oz (3.36 kg) M Vag-Spont EPI  LIV  5 Term 04/06/17 [redacted]w[redacted]d  6 lb 12.5 oz (3.076 kg) F Vag-Spont EPI  LIV  4 Term 03/10/11 [redacted]w[redacted]d 05:23 / 01:16 8 lb 0.9 oz (3.654 kg) F Vag-Spont Local  LIV     Birth Comments: WNL  3 SAB           2 Gravida           1 SAB            Has not had a period since delivery Contraception: Depo. Does not desire more children Last pap smear was ***.  Any history of abnormal pap smears: {yes/no:19897}.   Medications: She has a current medication list which includes the following prescription(s): nitrofurantoin (macrocrystal-monohydrate).   Allergies: Patient has No Known Allergies.  Social History:  Social History   Tobacco Use   Smoking status: Never   Smokeless tobacco: Never  Vaping Use   Vaping Use: Never used  Substance Use Topics   Alcohol use: No   Drug use: No    Relationship status: single She lives with her children.   She is employed. Regular exercise: No History of abuse: No  Family History:   Family History  Problem Relation Age of Onset   Early death Father      Review of Systems: Review of Systems  Constitutional:  Negative for fever, malaise/fatigue and weight loss.  Respiratory:  Negative for cough, shortness of breath and wheezing.   Cardiovascular:  Negative for chest pain, palpitations and leg swelling.  Gastrointestinal:  Negative for abdominal pain and blood in stool.  Genitourinary:  Negative for dysuria.   Musculoskeletal:  Negative for myalgias.  Skin:  Negative for rash.  Neurological:  Negative for dizziness and headaches.  Endo/Heme/Allergies:  Does not bruise/bleed easily.  Psychiatric/Behavioral:  Negative for depression. The patient is not nervous/anxious.      OBJECTIVE Physical Exam: Vitals:   08/29/21 1308  BP: 99/68  Pulse: 92  Weight: 167 lb (75.8 kg)  Height: 5\' 2"  (1.575 m)    Physical Exam Constitutional:      General: She is not in acute distress. Pulmonary:     Effort: Pulmonary effort is normal.  Abdominal:     General: There is no distension.     Palpations: Abdomen is soft.     Tenderness: There is no abdominal tenderness. There is no rebound.  Musculoskeletal:        General: No swelling. Normal range of motion.  Skin:    General: Skin is warm and dry.     Findings: No rash.  Neurological:     Mental Status: She is alert and oriented to person, place, and time.  Psychiatric:        Mood and Affect: Mood normal.        Behavior: Behavior normal.      GU / Detailed Urogynecologic Evaluation:  Pelvic Exam: Normal external female genitalia; Bartholin's and Skene's glands normal in appearance; urethral meatus normal in appearance, 2.5 x 4 firm cystic mass palpable along the urethra  CST: negative   Speculum exam reveals normal vaginal mucosa without atrophy. Cervix normal appearance. Uterus normal single, nontender. Adnexa no mass, fullness, tenderness.     Pelvic floor strength I/V  Pelvic floor musculature: Right levator non-tender, Right obturator non-tender, Left levator non-tender, Left obturator non-tender  POP-Q:   POP-Q  0                                            Aa   0                                           Ba  -7                                              C   5  Gh  4.5                                            Pb  9                                            tvl   -2                                             Ap  -2                                            Bp  -8.5                                              D     Rectal Exam:  Normal external rectum  Post-Void Residual (PVR) by Bladder Scan: In order to evaluate bladder emptying, we discussed obtaining a postvoid residual and she agreed to this procedure.  Procedure: The ultrasound unit was placed on the patient's abdomen in the suprapubic region after the patient had voided. A PVR of 4 ml was obtained by bladder scan.  Laboratory Results: POC urine: + blood, leukocytes   ASSESSMENT AND PLAN [name]-Sanchez is a 31 y.o. with:  1. Urethral diverticulum   2. Acute cystitis with hematuria   3. Leukocytes in urine   4. Prolapse of anterior vaginal wall       Marguerita Beards, MD   Medical Decision Making:  - Reviewed/ ordered a clinical laboratory test - Reviewed/ ordered a radiologic study - Reviewed/ ordered medicine test - Decision to obtain old records - Discussion of management of or test interpretation with an external physician / other healthcare professional  - Assessment requiring independent historian - Review and summation of prior records - Independent review of image, tracing or specimen

## 2021-08-30 LAB — URINE CULTURE: Culture: NO GROWTH

## 2021-11-16 ENCOUNTER — Other Ambulatory Visit: Payer: Self-pay | Admitting: Obstetrics and Gynecology

## 2021-11-16 NOTE — Progress Notes (Deleted)
CYSTOSCOPY  CC:  This is a 31 y.o. with {cysto problems:24783} who presents today for cystoscopy.  @ENCLABS @  There were no vitals taken for this visit.  CYSTOSCOPY: A time out was performed.  The periurethral area was prepped and draped in a sterile manner.  2% lidocaine jetpack was inserted at the urethral meatus and the urethra and bladder visualized with 12- and 70-degree scopes.  She had {Desc; normal/diminished:12763} urethral coaptation and {Desc; normal/abnormal w/wildcard:19060} urethral mucosa.  She had {Desc; normal/abnormal w/wildcard:19060} bladder mucosa. She had bilateral clear efflux from both ureteral orifices.  She had no squamous metaplasia at the trigone, no trabeculations, cellules or diverticuli.     ASSESSMENT:  31 y.o. with {cysto problems:24783}. Cystoscopy today is {Desc; normal/abnormal w/wildcard:19060}.  PLAN:  Follow-up to discuss findings and treatment options.  All questions answered and post-procedures instructions were given  Jaquita Folds, MD

## 2021-11-20 ENCOUNTER — Encounter: Payer: Self-pay | Admitting: *Deleted

## 2022-09-16 ENCOUNTER — Ambulatory Visit (HOSPITAL_COMMUNITY)
Admission: EM | Admit: 2022-09-16 | Discharge: 2022-09-16 | Disposition: A | Discharge status: Home / Self Care | Payer: Self-pay | Attending: Family Medicine | Admitting: Family Medicine

## 2022-09-16 ENCOUNTER — Encounter (HOSPITAL_COMMUNITY): Payer: Self-pay

## 2022-09-16 DIAGNOSIS — N760 Acute vaginitis: Secondary | ICD-10-CM

## 2022-09-16 DIAGNOSIS — R399 Unspecified symptoms and signs involving the genitourinary system: Secondary | ICD-10-CM

## 2022-09-16 DIAGNOSIS — Z113 Encounter for screening for infections with a predominantly sexual mode of transmission: Secondary | ICD-10-CM

## 2022-09-16 LAB — POCT URINALYSIS DIP (MANUAL ENTRY)
Bilirubin, UA: NEGATIVE
Glucose, UA: NEGATIVE mg/dL
Ketones, POC UA: NEGATIVE mg/dL
Nitrite, UA: NEGATIVE
Protein Ur, POC: NEGATIVE mg/dL
Spec Grav, UA: 1.01 (ref 1.010–1.025)
Urobilinogen, UA: 0.2 E.U./dL
pH, UA: 7 (ref 5.0–8.0)

## 2022-09-16 MED ORDER — CEPHALEXIN 500 MG PO CAPS: 500.0000 mg | ORAL_CAPSULE | Freq: Two times a day (BID) | ORAL | 0 refills | Status: AC

## 2022-09-16 NOTE — Discharge Instructions (Signed)
It was nice seeing you. Your urine showed slight  concern for UTI.  Urine sent for culture I sent antibiotic to your pharmacy for UTI I will call you back soon for STD results Use condoms for STD prevention See PCP soon for birth control conversation

## 2022-09-16 NOTE — ED Provider Notes (Signed)
[facility]    CSN: [id number] Arrival date & time: 09/16/22  1443      History   Chief Complaint Chief Complaint  Patient presents with   Urinary Tract Infection    HPI Amber Murphy is a 32 y.o. female.   The history is provided by the patient. No language interpreter was used.  Urinary Tract Infection Pain quality:  Burning (Dysuria x 1 week) Onset quality:  Gradual Duration:  1 week Timing:  Constant Chronicity:  New Recent urinary tract infections: yes   Relieved by:  Nothing Worsened by:  Nothing Urinary symptoms: foul-smelling urine and frequent urination   Urinary symptoms: no discolored urine and no hematuria   Associated symptoms: vaginal discharge   Associated symptoms: no nausea and no vomiting   Vaginal Discharge Quality:  Milky and yellow Severity:  Moderate Onset quality:  Gradual Duration:  1 week Timing:  Constant Progression:  Unchanged Chronicity:  New Context: after intercourse   Relieved by:  Nothing Exacerbated by: Started after she had intercourse with her spouse, worried about STD. Ineffective treatments:  None tried Associated symptoms: no nausea and no vomiting   Risk factors: STI exposure   Risk factors comment:  Hx of chlamydia in the past LMP 08/31/22 regular. Uses condoms for birth control.  Past Medical History:  Diagnosis Date   Medical history non-contributory    No pertinent past medical history     Patient Active Problem List   Diagnosis Date Noted   Uterine contractions 05/10/2021   Urethral diverticulum 04/19/2021   COVID-19 01/19/2019    Past Surgical History:  Procedure Laterality Date   NO PAST SURGERIES      OB History     Gravida  6   Para  3   Term  3   Preterm  0   AB  2   Living  3      SAB  2   IAB  0   Ectopic  0   Multiple  0   Live Births  3            Home Medications    Prior to Admission medications   Medication Sig Start Date End Date Taking?  Authorizing Provider  cephALEXin (KEFLEX) 500 MG capsule Take 1 capsule (500 mg total) by mouth 2 (two) times daily for 5 days. 09/16/22 09/21/22 Yes Doreene Eland, MD    Family History Family History  Problem Relation Age of Onset   Early death Father     Social History Social History   Tobacco Use   Smoking status: Never   Smokeless tobacco: Never  Vaping Use   Vaping status: Never Used  Substance Use Topics   Alcohol use: No   Drug use: No     Allergies   Patient has no known allergies.   Review of Systems Review of Systems  Gastrointestinal:  Negative for nausea and vomiting.  Genitourinary:  Positive for vaginal discharge.  All other systems reviewed and are negative.    Physical Exam Triage Vital Signs ED Triage Vitals  Encounter Vitals Group     BP 09/16/22 1550 122/89     Systolic BP Percentile --      Diastolic BP Percentile --      Pulse Rate 09/16/22 1550 81     Resp 09/16/22 1550 16     Temp 09/16/22 1550 97.8 F (36.6 C)     Temp Source 09/16/22 1550 Oral  SpO2 09/16/22 1550 98 %     Weight --      Height --      Head Circumference --      Peak Flow --      Pain Score 09/16/22 1551 8     Pain Loc --      Pain Education --      Exclude from Growth Chart --    No data found.  Updated Vital Signs BP 122/89 (BP Location: Left Arm)   Pulse 81   Temp 97.8 F (36.6 C) (Oral)   Resp 16   LMP 08/31/2022   SpO2 98%   Breastfeeding Yes   Visual Acuity Right Eye Distance:   Left Eye Distance:   Bilateral Distance:    Right Eye Near:   Left Eye Near:    Bilateral Near:     Physical Exam Vitals and nursing note reviewed.  Cardiovascular:     Rate and Rhythm: Normal rate and regular rhythm.     Heart sounds: Normal heart sounds. No murmur heard. Pulmonary:     Effort: Pulmonary effort is normal. No respiratory distress.     Breath sounds: Normal breath sounds. No wheezing.  Abdominal:     General: Abdomen is flat. There is no  distension.     Palpations: Abdomen is soft. There is no mass.     Tenderness: There is no abdominal tenderness.  Genitourinary:    Comments: Deferred     UC Treatments / Results  Labs (all labs ordered are listed, but only abnormal results are displayed) Labs Reviewed  POCT URINALYSIS DIP (MANUAL ENTRY) - Abnormal; Notable for the following components:      Result Value   Color, UA light yellow (*)    Blood, UA small (*)    Leukocytes, UA Small (1+) (*)    All other components within normal limits  CERVICOVAGINAL ANCILLARY ONLY    EKG   Radiology No results found.  Procedures Procedures (including critical care time)  Medications Ordered in UC Medications - No data to display  Initial Impression / Assessment and Plan / UC Course  I have reviewed the triage vital signs and the nursing notes.  Pertinent labs & imaging results that were available during my care of the patient were reviewed by me and considered in my medical decision making (see chart for details).  Clinical Course as of 09/16/22 1623  Sun Sep 16, 2022  1622 UTI symptoms UA positive with small leukocyte and nitrite negative Keflex escribed Urine culture sent - I will contact her with the result [KE]  1622  STD screening Ancillary cervicovaginal self-swab sent for cytology I will contact her soon with test results Continue Condom for STD prevention  Consider OCPs I advised her to establish a PCP care and she agreed with the plan [KE]    Clinical Course User Index [KE] Doreene Eland, MD     Final Clinical Impressions(s) / UC Diagnoses   Final diagnoses:  Vaginitis and vulvovaginitis  Screening for STD (sexually transmitted disease)  UTI symptoms     Discharge Instructions      It was nice seeing you. Your urine showed slight  concern for UTI.  Urine sent for culture I sent antibiotic to your pharmacy for UTI I will call you back soon for STD results Use condoms for STD  prevention See PCP soon for birth control conversation     ED Prescriptions     Medication Sig Dispense  Auth. Provider   cephALEXin (KEFLEX) 500 MG capsule Take 1 capsule (500 mg total) by mouth 2 (two) times daily for 5 days. 10 capsule Doreene Eland, MD      PDMP not reviewed this encounter.   Doreene Eland, MD 09/16/22 [id number]

## 2022-09-16 NOTE — ED Triage Notes (Signed)
Pt c/o burning on urination with urgency/frequency and bladder pressure x3 days. States has a dropped bladder since her last child. Denies taking any meds.

## 2022-09-17 LAB — CERVICOVAGINAL ANCILLARY ONLY
Bacterial Vaginitis (gardnerella): NEGATIVE
Candida Glabrata: NEGATIVE
Candida Vaginitis: NEGATIVE
Chlamydia: NEGATIVE
Comment: NEGATIVE
Comment: NEGATIVE
Comment: NEGATIVE
Comment: NEGATIVE
Comment: NEGATIVE
Comment: NORMAL
Neisseria Gonorrhea: NEGATIVE
Trichomonas: NEGATIVE

## 2022-09-18 ENCOUNTER — Telehealth: Payer: Self-pay | Admitting: Family Medicine

## 2022-09-18 NOTE — Telephone Encounter (Signed)
HIPAA compliant callback message left.  Please advise when she calls - Normal STD screen.

## 2022-11-20 ENCOUNTER — Inpatient Hospital Stay (HOSPITAL_COMMUNITY)
Admission: AD | Admit: 2022-11-20 | Discharge: 2022-11-20 | Disposition: A | Discharge status: Home / Self Care | Payer: Self-pay | Attending: Obstetrics and Gynecology | Admitting: Obstetrics and Gynecology

## 2022-11-20 ENCOUNTER — Inpatient Hospital Stay (HOSPITAL_COMMUNITY): Payer: Self-pay

## 2022-11-20 ENCOUNTER — Encounter (HOSPITAL_COMMUNITY): Payer: Self-pay | Admitting: Obstetrics and Gynecology

## 2022-11-20 ENCOUNTER — Other Ambulatory Visit: Payer: Self-pay

## 2022-11-20 DIAGNOSIS — O26851 Spotting complicating pregnancy, first trimester: Secondary | ICD-10-CM | POA: Insufficient documentation

## 2022-11-20 DIAGNOSIS — Z3A01 Less than 8 weeks gestation of pregnancy: Secondary | ICD-10-CM

## 2022-11-20 DIAGNOSIS — N939 Abnormal uterine and vaginal bleeding, unspecified: Secondary | ICD-10-CM

## 2022-11-20 DIAGNOSIS — M25552 Pain in left hip: Secondary | ICD-10-CM

## 2022-11-20 DIAGNOSIS — O26891 Other specified pregnancy related conditions, first trimester: Secondary | ICD-10-CM | POA: Insufficient documentation

## 2022-11-20 LAB — COMPREHENSIVE METABOLIC PANEL
ALT: 14 U/L (ref 0–44)
AST: 14 U/L — ABNORMAL LOW (ref 15–41)
Albumin: 3.9 g/dL (ref 3.5–5.0)
Alkaline Phosphatase: 58 U/L (ref 38–126)
Anion gap: 10 (ref 5–15)
BUN: 11 mg/dL (ref 6–20)
CO2: 19 mmol/L — ABNORMAL LOW (ref 22–32)
Calcium: 8.9 mg/dL (ref 8.9–10.3)
Chloride: 107 mmol/L (ref 98–111)
Creatinine, Ser: 0.59 mg/dL (ref 0.44–1.00)
GFR, Estimated: >60 mL/min (ref >60)
Glucose, Bld: 90 mg/dL (ref 70–99)
Potassium: 3.7 mmol/L (ref 3.5–5.1)
Sodium: 136 mmol/L (ref 135–145)
Total Bilirubin: 0.7 mg/dL (ref 0.3–1.2)
Total Protein: 7.1 g/dL (ref 6.5–8.1)

## 2022-11-20 LAB — URINALYSIS, ROUTINE W REFLEX MICROSCOPIC
Bilirubin Urine: NEGATIVE
Glucose, UA: NEGATIVE mg/dL
Hgb urine dipstick: NEGATIVE
Ketones, ur: NEGATIVE mg/dL
Leukocytes,Ua: NEGATIVE
Nitrite: NEGATIVE
Protein, ur: NEGATIVE mg/dL
Specific Gravity, Urine: 1.018 (ref 1.005–1.030)
pH: 7 (ref 5.0–8.0)

## 2022-11-20 LAB — WET PREP, GENITAL
Clue Cells Wet Prep HPF POC: NONE SEEN
Sperm: NONE SEEN
Trich, Wet Prep: NONE SEEN
WBC, Wet Prep HPF POC: <10 (ref <10)
Yeast Wet Prep HPF POC: NONE SEEN

## 2022-11-20 LAB — CBC
HCT: 40.2 % (ref 36.0–46.0)
Hemoglobin: 13.7 g/dL (ref 12.0–15.0)
MCH: 29.5 pg (ref 26.0–34.0)
MCHC: 34.1 g/dL (ref 30.0–36.0)
MCV: 86.6 fL (ref 80.0–100.0)
Platelets: 311 10*3/uL (ref 150–400)
RBC: 4.64 MIL/uL (ref 3.87–5.11)
RDW: 13.1 % (ref 11.5–15.5)
WBC: 8.7 10*3/uL (ref 4.0–10.5)
nRBC: 0 % (ref 0.0–0.2)

## 2022-11-20 LAB — POCT PREGNANCY, URINE: Preg Test, Ur: POSITIVE — AB

## 2022-11-20 LAB — HCG, QUANTITATIVE, PREGNANCY: hCG, Beta Chain, Quant, S: 35312 m[IU]/mL — ABNORMAL HIGH (ref <5)

## 2022-11-20 MED ORDER — ACETAMINOPHEN-CAFFEINE 500-65 MG PO TABS
2.0000 | ORAL_TABLET | Freq: Once | ORAL | Status: AC
  Administered 2022-11-20: 2 via ORAL
  Filled 2022-11-20: qty 2

## 2022-11-20 MED ORDER — ACETAMINOPHEN-CAFFEINE 500-65 MG PO TABS: 1.0000 | ORAL_TABLET | Freq: Three times a day (TID) | ORAL | 0 refills | Status: AC | PRN

## 2022-11-20 MED ORDER — ONDANSETRON 4 MG PO TBDP: 4.0000 mg | ORAL_TABLET | Freq: Three times a day (TID) | ORAL | 0 refills | Status: AC | PRN

## 2022-11-20 NOTE — MAU Note (Signed)
Amber Murphy is a 32 y.o. at Unknown here in MAU reporting: she's pregnant and began having left lower quadrant abdominal pain that began last night.  States pain is intermittent and cramping.  Reports had spotting with wiping last night but that has since resolved.  Denies current Vb.  Also reports has a H/A, took Tylenol @ 0700 this morning.  States HA relieved with tylenol but returning. LMP: 09/30/2022 Onset of complaint: last night Pain score: 6 Vitals:   11/20/22 1530  BP: 104/62  Pulse: 68  Resp: 18  Temp: 98 F (36.7 C)  SpO2: 100%     FHT:NA Lab orders placed from triage:   UPT

## 2022-11-20 NOTE — Discharge Instructions (Signed)
Safe Medications in Pregnancy   Acne: Benzoyl Peroxide Salicylic Acid  Backache/Headache: Tylenol: 2 regular strength every 4 hours OR              2 Extra strength every 6 hours  Colds/Coughs/Allergies: Benadryl (alcohol free) 25 mg every 6 hours as needed Breath right strips Claritin Cepacol throat lozenges Chloraseptic throat spray Cold-Eeze- up to three times per day Cough drops, alcohol free Flonase (by prescription only) Guaifenesin Mucinex Robitussin DM (plain only, alcohol free) Saline nasal spray/drops Sudafed (pseudoephedrine) & Actifed ** use only after [redacted] weeks gestation and if you do not have high blood pressure Tylenol Vicks Vaporub Zinc lozenges Zyrtec   Constipation: Colace Ducolax suppositories Fleet enema Glycerin suppositories Metamucil Milk of magnesia Miralax Senokot Smooth move tea  Diarrhea: Kaopectate Imodium A-D  *NO pepto Bismol  Hemorrhoids: Anusol Anusol HC Preparation H Tucks  Indigestion: Tums Maalox Mylanta Zantac  Pepcid  Insomnia: Benadryl (alcohol free) 25mg  every 6 hours as needed Tylenol PM Unisom, no Gelcaps  Leg Cramps: Tums MagGel  Nausea/Vomiting:  Bonine Dramamine Emetrol Ginger extract Sea bands Meclizine  Nausea medication to take during pregnancy:  Unisom (doxylamine succinate 25 mg tablets) Take one tablet daily at bedtime. If symptoms are not adequately controlled, the dose can be increased to a maximum recommended dose of two tablets daily (1/2 tablet in the morning, 1/2 tablet mid-afternoon and one at bedtime). Vitamin B6 100mg  tablets. Take one tablet twice a day (up to 200 mg per day).  Skin Rashes: Aveeno products Benadryl cream or 25mg  every 6 hours as needed Calamine Lotion 1% cortisone cream  Yeast infection: Gyne-lotrimin 7 Monistat 7   **If taking multiple medications, please check labels to avoid duplicating the same active ingredients **take medication as directed on  the label ** Do not exceed 3000 mg of tylenol in 24 hours **Do not take medications that contain aspirin or ibuprofen

## 2022-11-20 NOTE — MAU Provider Note (Signed)
Chief Complaint: Abdominal Pain, Vaginal Bleeding, and Headache   Provider to bedside 1637    SUBJECTIVE HPI: Amber Murphy is a 32 y.o. [id number] at [redacted]w[redacted]d by LMP who presents to maternity admissions reporting LLQ cramping and vaginal spotting.  Patient notes LLQ abdominal pain that started last night. Cramping in nature and sharp at times. Seems to be related to lying on side and movement. Had spotting when she wiped with toilet tissue last night. No vaginal bleeding today. Denies LOF, change in vaginal discharge, urinary symptoms. Does note mild headache as well.  HPI  Past Medical History:  Diagnosis Date   Medical history non-contributory    No pertinent past medical history    Past Surgical History:  Procedure Laterality Date   NO PAST SURGERIES     Social History   Socioeconomic History   Marital status: Single    Spouse name: Not on file   Number of children: Not on file   Years of education: Not on file   Highest education level: Not on file  Occupational History   Not on file  Tobacco Use   Smoking status: Never   Smokeless tobacco: Never  Vaping Use   Vaping status: Never Used  Substance and Sexual Activity   Alcohol use: No   Drug use: No   Sexual activity: Yes    Birth control/protection: None  Other Topics Concern   Not on file  Social History Narrative   ** Merged History Encounter **       Social Determinants of Health   Financial Resource Strain: Not on file  Food Insecurity: Not on file  Transportation Needs: Not on file  Physical Activity: Not on file  Stress: Not on file  Social Connections: Not on file  Intimate Partner Violence: Not on file   No current facility-administered medications on file prior to encounter.   No current outpatient medications on file prior to encounter.   No Known Allergies  ROS:  Pertinent positives/negatives listed above.  I have reviewed patient's Past Medical Hx, Surgical Hx, Family Hx, Social  Hx, medications and allergies.   Physical Exam  Patient Vitals for the past 24 hrs:  BP Temp Temp src Pulse Resp SpO2 Height Weight  11/20/22 1530 104/62 98 F (36.7 C) Oral 68 18 100 % -- --  11/20/22 1524 -- -- -- -- -- -- 5' (1.524 m) 69.4 kg   Constitutional: Well-developed, well-nourished female in no acute distress.  Cardiovascular: normal rate Respiratory: normal effort GI: Abd soft, non-tender. Pos BS x 4 MS: Extremities nontender, no edema, normal ROM Neurologic: Alert and oriented x 4.   LAB RESULTS Results for orders placed or performed during the hospital encounter of 11/20/22 (from the past 24 hour(s))  Pregnancy, urine POC     Status: Abnormal   Collection Time: 11/20/22  3:39 PM  Result Value Ref Range   Preg Test, Ur POSITIVE (A) NEGATIVE  Urinalysis, Routine w reflex microscopic -Urine, Clean Catch     Status: None   Collection Time: 11/20/22  3:43 PM  Result Value Ref Range   Color, Urine YELLOW YELLOW   APPearance CLEAR CLEAR   Specific Gravity, Urine 1.018 1.005 - 1.030   pH 7.0 5.0 - 8.0   Glucose, UA NEGATIVE NEGATIVE mg/dL   Hgb urine dipstick NEGATIVE NEGATIVE   Bilirubin Urine NEGATIVE NEGATIVE   Ketones, ur NEGATIVE NEGATIVE mg/dL   Protein, ur NEGATIVE NEGATIVE mg/dL   Nitrite NEGATIVE NEGATIVE   Leukocytes,Ua  NEGATIVE NEGATIVE  CBC     Status: None   Collection Time: 11/20/22  3:54 PM  Result Value Ref Range   WBC 8.7 4.0 - 10.5 K/uL   RBC 4.64 3.87 - 5.11 MIL/uL   Hemoglobin 13.7 12.0 - 15.0 g/dL   HCT 09.8 11.9 - 14.7 %   MCV 86.6 80.0 - 100.0 fL   MCH 29.5 26.0 - 34.0 pg   MCHC 34.1 30.0 - 36.0 g/dL   RDW 82.9 56.2 - 13.0 %   Platelets 311 150 - 400 K/uL   nRBC 0.0 0.0 - 0.2 %  Comprehensive metabolic panel     Status: Abnormal   Collection Time: 11/20/22  3:54 PM  Result Value Ref Range   Sodium 136 135 - 145 mmol/L   Potassium 3.7 3.5 - 5.1 mmol/L   Chloride 107 98 - 111 mmol/L   CO2 19 (L) 22 - 32 mmol/L   Glucose, Bld 90 70 -  99 mg/dL   BUN 11 6 - 20 mg/dL   Creatinine, Ser 8.65 0.44 - 1.00 mg/dL   Calcium 8.9 8.9 - 78.4 mg/dL   Total Protein 7.1 6.5 - 8.1 g/dL   Albumin 3.9 3.5 - 5.0 g/dL   AST 14 (L) 15 - 41 U/L   ALT 14 0 - 44 U/L   Alkaline Phosphatase 58 38 - 126 U/L   Total Bilirubin 0.7 0.3 - 1.2 mg/dL   GFR, Estimated >69 >62 mL/min   Anion gap 10 5 - 15  ABO/Rh     Status: None   Collection Time: 11/20/22  3:54 PM  Result Value Ref Range   ABO/RH(D)      O POS Performed at [facility], [address] [place], [place] [address]   Wet prep, genital     Status: None   Collection Time: 11/20/22  3:54 PM   Specimen: Vaginal  Result Value Ref Range   Yeast Wet Prep HPF POC NONE SEEN NONE SEEN   Trich, Wet Prep NONE SEEN NONE SEEN   Clue Cells Wet Prep HPF POC NONE SEEN NONE SEEN   WBC, Wet Prep HPF POC <10 <10   Sperm NONE SEEN     --/--/O POS Performed at [facility], [address] [place], [place] [address]  (10/22 1554)  IMAGING US OB LESS THAN 14 WEEKS WITH OB TRANSVAGINAL  Result Date: 11/20/2022 CLINICAL DATA:  Left lower quadrant pain EXAM: OBSTETRIC <14 WK [place] AND TRANSVAGINAL OB US TECHNIQUE: Both transabdominal and transvaginal ultrasound examinations were performed for complete evaluation of the gestation as well as the maternal uterus, adnexal regions, and pelvic cul-de-sac. Transvaginal technique was performed to assess early pregnancy. COMPARISON:  None Available. FINDINGS: Intrauterine gestational sac: Single Yolk sac:  Visualized. Embryo:  Visualized. Cardiac Activity: Visualized. Heart Rate: 124 bpm CRL:  4.8 mm   6 w   1 d                  [place] EDC: 07/15/2023 Subchorionic hemorrhage:  None visualized. Maternal uterus/adnexae: Normal appearance of the bilateral ovaries. No free fluid in the pelvis. IMPRESSION: Single viable intrauterine pregnancy with estimated gestational age of [redacted] weeks 1 days. Electronically Signed   By: Allegra Lai M.D.   On: 11/20/2022  16:45    MAU Management/MDM: Orders Placed This Encounter  Procedures   Wet prep, genital   US OB LESS THAN 14 WEEKS WITH OB TRANSVAGINAL   CBC  Comprehensive metabolic panel   hCG, quantitative, pregnancy   Urinalysis, Routine w reflex microscopic -Urine, Clean Catch   Pregnancy, urine POC   ABO/Rh   Discharge patient    Meds ordered this encounter  Medications   acetaminophen-caffeine (EXCEDRIN TENSION HEADACHE) 500-65 MG per tablet 2 tablet   ondansetron (ZOFRAN-ODT) 4 MG disintegrating tablet    Sig: Take 1 tablet (4 mg total) by mouth every 8 (eight) hours as needed for nausea or vomiting.    Dispense:  30 tablet    Refill:  0   acetaminophen-caffeine (EXCEDRIN TENSION HEADACHE) 500-65 MG TABS per tablet    Sig: Take 1-2 tablets by mouth every 8 (eight) hours as needed (Headache).    Dispense:  60 tablet    Refill:  0    Patient presents for LLQ pain in early pregnancy. Concern for ectopic pregnancy. This was rule-out with work-up above. Work-up confirms viable IUP. Suspect cramping 2/2 MSK etiology and much less likely a miscarriage. Can use tylenol for pain.  Patient has intake OB visit scheduled with [facility] coming up. Sent in Excedrin for headache and zofran for nausea. Patient will get PNV from [facility] per her preference.  ASSESSMENT 1. Pain of left hip   2. Vaginal spotting   3. [redacted] weeks gestation of pregnancy     PLAN Discharge home with strict return precautions. Allergies as of 11/20/2022   No Known Allergies      Medication List     TAKE these medications    acetaminophen-caffeine 500-65 MG Tabs per tablet Commonly known as: EXCEDRIN TENSION HEADACHE Take 1-2 tablets by mouth every 8 (eight) hours as needed (Headache).   ondansetron 4 MG disintegrating tablet Commonly known as: ZOFRAN-ODT Take 1 tablet (4 mg total) by mouth every 8 (eight) hours as needed for nausea or vomiting.         Wylene Simmer, MD OB Fellow 11/20/2022  5:01 PM

## 2022-11-21 LAB — GC/CHLAMYDIA PROBE AMP (~~LOC~~) NOT AT ARMC
Chlamydia: NEGATIVE
Comment: NEGATIVE
Comment: NORMAL
Neisseria Gonorrhea: NEGATIVE

## 2022-11-21 LAB — ABO/RH: ABO/RH(D): O POS

## 2023-10-12 ENCOUNTER — Emergency Department (HOSPITAL_COMMUNITY)
Admission: EM | Admit: 2023-10-12 | Discharge: 2023-10-12 | Disposition: A | Discharge status: Home / Self Care | Payer: Self-pay | Attending: Emergency Medicine | Admitting: Emergency Medicine

## 2023-10-12 ENCOUNTER — Encounter (HOSPITAL_COMMUNITY): Payer: Self-pay

## 2023-10-12 ENCOUNTER — Other Ambulatory Visit: Payer: Self-pay

## 2023-10-12 DIAGNOSIS — B029 Zoster without complications: Secondary | ICD-10-CM | POA: Insufficient documentation

## 2023-10-12 DIAGNOSIS — R300 Dysuria: Secondary | ICD-10-CM | POA: Insufficient documentation

## 2023-10-12 DIAGNOSIS — N390 Urinary tract infection, site not specified: Secondary | ICD-10-CM

## 2023-10-12 LAB — URINALYSIS, ROUTINE W REFLEX MICROSCOPIC
Bilirubin Urine: NEGATIVE
Glucose, UA: NEGATIVE mg/dL
Ketones, ur: NEGATIVE mg/dL
Nitrite: NEGATIVE
Protein, ur: NEGATIVE mg/dL
Specific Gravity, Urine: 1.009 (ref 1.005–1.030)
pH: 6 (ref 5.0–8.0)

## 2023-10-12 LAB — PREGNANCY, URINE: Preg Test, Ur: NEGATIVE

## 2023-10-12 MED ORDER — CEFTRIAXONE SODIUM 1 G IJ SOLR
1.0000 g | Freq: Once | INTRAMUSCULAR | Status: AC
  Administered 2023-10-12: 1 g via INTRAVENOUS
  Filled 2023-10-12: qty 10

## 2023-10-12 MED ORDER — CEPHALEXIN 500 MG PO CAPS: 500.0000 mg | ORAL_CAPSULE | Freq: Four times a day (QID) | ORAL | 0 refills | Status: AC

## 2023-10-12 MED ORDER — PREDNISONE 10 MG PO TABS: 60.0000 mg | ORAL_TABLET | Freq: Every day | ORAL | 0 refills | Status: AC

## 2023-10-12 MED ORDER — VALACYCLOVIR HCL 1 G PO TABS: 1000.0000 mg | ORAL_TABLET | Freq: Three times a day (TID) | ORAL | 0 refills | Status: AC

## 2023-10-12 MED ORDER — KETOROLAC TROMETHAMINE 30 MG/ML IJ SOLN
30.0000 mg | Freq: Once | INTRAMUSCULAR | Status: AC
  Administered 2023-10-12: 30 mg via INTRAMUSCULAR
  Filled 2023-10-12: qty 1

## 2023-10-12 MED ORDER — PREDNISONE 20 MG PO TABS
60.0000 mg | ORAL_TABLET | Freq: Once | ORAL | Status: AC
  Administered 2023-10-12: 60 mg via ORAL
  Filled 2023-10-12: qty 3

## 2023-10-12 MED ORDER — VALACYCLOVIR HCL 500 MG PO TABS
1000.0000 mg | ORAL_TABLET | Freq: Three times a day (TID) | ORAL | Status: DC
  Administered 2023-10-12: 1000 mg via ORAL
  Filled 2023-10-12 (×2): qty 2

## 2023-10-12 NOTE — ED Triage Notes (Signed)
 Here by POV from home for facial lesions (R forehead, R eyelid) with associated HA, R parotid/ mastoid area TTP and swelling. Onset 3d ago. Mentions fever initially of 1 night which has resolved. Alert, NAD, calm, interactive, steady gait. Denies NV, dizziness. Rates HA 8/10.

## 2023-10-12 NOTE — Discharge Instructions (Addendum)
 It was our pleasure to provide your ER care today - we hope that you feel better.  Drink plenty of fluids/stay well hydrated. Take medications as prescribed, and take antibiotic (keflex ) as prescribed.   Follow up closely with primary care doctor this coming week if symptoms fail to improve/resolve.  Return to ER if worse, new symptoms, fevers, new or worsening or severe abdominal pain, persistent vomiting, severe headache, or other concern.

## 2023-10-12 NOTE — ED Provider Notes (Signed)
 [facility] Provider Note   CSN: [id number] Arrival date & time: 10/12/23  1405     Patient presents with: Headache   Amber Murphy is a 33 y.o. female presented to ED complaining of sore throat, right-sided neck pain, headache, burning rash onset about 3 to 4 days ago.  Denies sick contacts in the house.  Reports no significant medical history.  Separately she when she does have some burning urination for a few days.   HPI     Prior to Admission medications   Medication Sig Start Date End Date Taking? Authorizing Provider  predniSONE  (DELTASONE ) 10 MG tablet Take 6 tablets (60 mg total) by mouth daily with breakfast for 5 days. 10/13/23 10/18/23 Yes Addysin Porco, Donnice PARAS, MD  valACYclovir  (VALTREX ) 1000 MG tablet Take 1 tablet (1,000 mg total) by mouth 3 (three) times daily for 7 days. 10/12/23 10/19/23 Yes Lorianna Spadaccini, Donnice PARAS, MD  acetaminophen -caffeine  (EXCEDRIN TENSION HEADACHE) 500-65 MG TABS per tablet Take 1-2 tablets by mouth every 8 (eight) hours as needed (Headache). 11/20/22   Nicholaus Almarie HERO, MD  ondansetron  (ZOFRAN -ODT) 4 MG disintegrating tablet Take 1 tablet (4 mg total) by mouth every 8 (eight) hours as needed for nausea or vomiting. 11/20/22   Nicholaus Almarie HERO, MD    Allergies: Patient has no known allergies.    Review of Systems  Updated Vital Signs BP 115/83 (BP Location: Right Arm)   Pulse 79   Temp 98.2 F (36.8 C)   Resp 16   Ht 5' (1.524 m)   Wt 70.3 kg   SpO2 98%   BMI 30.27 kg/m   Physical Exam Constitutional:      General: She is not in acute distress. HENT:     Head: Normocephalic and atraumatic.     Comments: Tender right-sided postauricular lymphadenopathy    Mouth/Throat:     Mouth: Mucous membranes are moist.     Pharynx: Oropharynx is clear. Uvula midline. No pharyngeal swelling or oropharyngeal exudate.     Tonsils: No tonsillar exudate or tonsillar abscesses.  Eyes:      Conjunctiva/sclera: Conjunctivae normal.     Pupils: Pupils are equal, round, and reactive to light.  Cardiovascular:     Rate and Rhythm: Normal rate and regular rhythm.  Pulmonary:     Effort: Pulmonary effort is normal. No respiratory distress.  Abdominal:     General: There is no distension.     Tenderness: There is no abdominal tenderness.  Skin:    General: Skin is warm and dry.     Comments: Vesicular rash in a V1 distribution on the right side of the face, sparing the eye  Neurological:     General: No focal deficit present.     Mental Status: She is alert. Mental status is at baseline.  Psychiatric:        Mood and Affect: Mood normal.        Behavior: Behavior normal.     (all labs ordered are listed, but only abnormal results are displayed) Labs Reviewed  URINALYSIS, ROUTINE W REFLEX MICROSCOPIC    EKG: None  Radiology: No results found.   Procedures   Medications Ordered in the ED  valACYclovir  (VALTREX ) tablet 1,000 mg (has no administration in time range)  ketorolac  (TORADOL ) 30 MG/ML injection 30 mg (has no administration in time range)  predniSONE  (DELTASONE ) tablet 60 mg (has no administration in time range)  Medical Decision Making Amount and/or Complexity of Data Reviewed Labs: ordered.  Risk Prescription drug management.   Patient is here suspected viral shingles outbreak.  She has some tender postauricular lymphadenopathy.  No parotid gland tenderness.  No indication for CT imaging.  Doubt bacterial infection.  No indication for antibiotics.  Patient does report dysuria and so we will check urine for possible UTI.  Will start her on Valtrex  and steroids.  No ocular involvement with the shingles.  Contact precautions were discussed  Will be signed out to Dr Bernard EDP at 3 pm pending follow up on UA - anticipate discharge afterwards     Final diagnoses:  Herpes zoster without complication  Dysuria     ED Discharge Orders          Ordered    predniSONE  (DELTASONE ) 10 MG tablet  Daily with breakfast        10/12/23 1451    valACYclovir  (VALTREX ) 1000 MG tablet  3 times daily        10/12/23 1451               Cottie Donnice PARAS, MD 10/12/23 1504

## 2023-10-12 NOTE — ED Provider Notes (Signed)
 Signed out that workup is done with exception that UA is pending and pt does have dysuria - tx for uti if positive.   UA w 11-20 wbc.   Recheck pt comfortable, no distress, no nv. Vitals normal.   Rocephin  iv.   Rx for home.   Pt currently appears stable for ed d/c per Dr Axel plan.     Bernard Drivers, MD 10/12/23 [phone]
